# Patient Record
Sex: Male | Born: 1985
Health system: Southern US, Community
[De-identification: ages and names within clinical notes are randomized; demographics above are authoritative.]

## PROBLEM LIST (undated history)

## (undated) DIAGNOSIS — K219 Gastro-esophageal reflux disease without esophagitis: Secondary | ICD-10-CM

## (undated) DIAGNOSIS — R519 Headache, unspecified: Secondary | ICD-10-CM

## (undated) DIAGNOSIS — K439 Ventral hernia without obstruction or gangrene: Secondary | ICD-10-CM

## (undated) DIAGNOSIS — R51 Headache: Secondary | ICD-10-CM

## (undated) DIAGNOSIS — I10 Essential (primary) hypertension: Secondary | ICD-10-CM

## (undated) HISTORY — PX: WISDOM TOOTH EXTRACTION: SHX21

## (undated) HISTORY — PX: VASECTOMY: SHX75

## (undated) HISTORY — PX: HERNIA REPAIR: SHX51

---

## 2012-12-19 ENCOUNTER — Encounter (HOSPITAL_COMMUNITY): Payer: Self-pay | Admitting: Cardiology

## 2012-12-19 ENCOUNTER — Emergency Department (HOSPITAL_COMMUNITY)
Admission: EM | Admit: 2012-12-19 | Discharge: 2012-12-19 | Disposition: A | Discharge status: Home / Self Care | Payer: 59 | Attending: Emergency Medicine | Admitting: Emergency Medicine

## 2012-12-19 ENCOUNTER — Emergency Department (HOSPITAL_COMMUNITY): Payer: 59

## 2012-12-19 DIAGNOSIS — R112 Nausea with vomiting, unspecified: Secondary | ICD-10-CM | POA: Insufficient documentation

## 2012-12-19 DIAGNOSIS — N200 Calculus of kidney: Secondary | ICD-10-CM | POA: Insufficient documentation

## 2012-12-19 LAB — URINALYSIS, ROUTINE W REFLEX MICROSCOPIC
Bilirubin Urine: NEGATIVE
Hgb urine dipstick: NEGATIVE
Nitrite: NEGATIVE
Specific Gravity, Urine: 1.021 (ref 1.005–1.030)
Urobilinogen, UA: 1 mg/dL (ref 0.0–1.0)
pH: 8.5 — ABNORMAL HIGH (ref 5.0–8.0)

## 2012-12-19 LAB — CBC WITH DIFFERENTIAL/PLATELET
Basophils Absolute: 0 10*3/uL (ref 0.0–0.1)
Basophils Relative: 0 % (ref 0–1)
Eosinophils Absolute: 0.1 10*3/uL (ref 0.0–0.7)
Hemoglobin: 14.6 g/dL (ref 13.0–17.0)
MCH: 28.5 pg (ref 26.0–34.0)
MCHC: 35.4 g/dL (ref 30.0–36.0)
Neutro Abs: 5.7 10*3/uL (ref 1.7–7.7)
Neutrophils Relative %: 64 % (ref 43–77)
Platelets: 281 10*3/uL (ref 150–400)
RDW: 13 % (ref 11.5–15.5)

## 2012-12-19 LAB — LIPASE, BLOOD: Lipase: 11 U/L (ref 11–59)

## 2012-12-19 LAB — HEPATIC FUNCTION PANEL
ALT: 32 U/L (ref 0–53)
AST: 26 U/L (ref 0–37)
Bilirubin, Direct: 0.1 mg/dL (ref 0.0–0.3)
Total Protein: 7.5 g/dL (ref 6.0–8.3)

## 2012-12-19 LAB — BASIC METABOLIC PANEL
Chloride: 102 mEq/L (ref 96–112)
GFR calc Af Amer: >90 mL/min (ref >90)
GFR calc non Af Amer: 84 mL/min — ABNORMAL LOW (ref >90)
Potassium: 3.4 mEq/L — ABNORMAL LOW (ref 3.5–5.1)
Sodium: 138 mEq/L (ref 135–145)

## 2012-12-19 LAB — URINE MICROSCOPIC-ADD ON

## 2012-12-19 MED ORDER — KETOROLAC TROMETHAMINE 30 MG/ML IJ SOLN
15.0000 mg | Freq: Once | INTRAMUSCULAR | Status: AC
  Administered 2012-12-19: 15 mg via INTRAVENOUS
  Filled 2012-12-19: qty 1

## 2012-12-19 MED ORDER — HYDROMORPHONE HCL PF 1 MG/ML IJ SOLN
1.0000 mg | Freq: Once | INTRAMUSCULAR | Status: AC
  Administered 2012-12-19: 1 mg via INTRAVENOUS
  Filled 2012-12-19: qty 1

## 2012-12-19 MED ORDER — PROMETHAZINE HCL 25 MG PO TABS: 25.0000 mg | ORAL_TABLET | Freq: Four times a day (QID) | ORAL | Status: DC | PRN

## 2012-12-19 MED ORDER — ONDANSETRON HCL 4 MG/2ML IJ SOLN
4.0000 mg | Freq: Once | INTRAMUSCULAR | Status: AC
  Administered 2012-12-19: 4 mg via INTRAVENOUS
  Filled 2012-12-19: qty 2

## 2012-12-19 MED ORDER — OXYCODONE-ACETAMINOPHEN 5-325 MG PO TABS: ORAL_TABLET | ORAL | Status: DC

## 2012-12-19 MED ORDER — HYDROMORPHONE HCL PF 1 MG/ML IJ SOLN: 0.5000 mg | Freq: Once | INTRAMUSCULAR | Status: DC

## 2012-12-19 MED ORDER — HYDROMORPHONE HCL PF 1 MG/ML IJ SOLN
0.5000 mg | Freq: Once | INTRAMUSCULAR | Status: AC
  Administered 2012-12-19: 0.5 mg via INTRAVENOUS
  Filled 2012-12-19: qty 1

## 2012-12-19 NOTE — ED Notes (Signed)
Pt reports he just started having right sided abd pain and flank pain this morning. Pt is restless and vomiting at triage. Denies any hx of kidney stones.

## 2012-12-19 NOTE — ED Provider Notes (Signed)
CSN: [id number]     Arrival date & time 12/19/12  1139 History     First MD Initiated Contact with Patient 12/19/12 1157     Chief Complaint  Patient presents with  . Abdominal Pain  . Nausea  . Emesis   (Consider location/radiation/quality/duration/timing/severity/associated sxs/prior Treatment) HPI  EBON KETCHUM is a 27 y.o. male with no significant past medical history complaining of acute onset of left-sided mid abdominal pain, radiating to back and groin, acute onset at 11 AM today. No exacerbating or alleviating factors identified. Pain is described as sharp, 10 out of 10. No prior similar episodes, no prior history of kidney stones. Patient denies hematuria or dysuria. Associated symptoms of multiple episodes of nonbloody, nonbilious, no coffee ground emesis.    History reviewed. No pertinent past medical history. History reviewed. No pertinent past surgical history. History reviewed. No pertinent family history. History  Substance Use Topics  . Smoking status: Never Smoker   . Smokeless tobacco: Not on file  . Alcohol Use: Yes    Review of Systems 10 systems reviewed and found to be negative, except as noted in the HPI   Allergies  Review of patient's allergies indicates no known allergies.  Home Medications   Current Outpatient Rx  Name  Route  Sig  Dispense  Refill  . acetaminophen (TYLENOL) 325 MG tablet   Oral   Take 650 mg by mouth every 6 (six) hours as needed for pain.         Marland Kitchen ibuprofen (ADVIL,MOTRIN) 200 MG tablet   Oral   Take 200 mg by mouth every 6 (six) hours as needed for pain.          Pulse 119  Temp(Src) 97.8 F (36.6 C) (Oral)  Resp 30  SpO2 100% Physical Exam  Nursing note and vitals reviewed. Constitutional: He is oriented to person, place, and time. He appears well-developed and well-nourished. No distress.  HENT:  Head: Normocephalic.  Mouth/Throat: Oropharynx is clear and moist.  Eyes: Conjunctivae and EOM are normal.  Pupils are equal, round, and reactive to light.  Neck: Normal range of motion.  Cardiovascular: Normal rate, regular rhythm and intact distal pulses.   Pulmonary/Chest: Effort normal and breath sounds normal. No stridor. No respiratory distress. He has no wheezes. He has no rales. He exhibits no tenderness.  Abdominal: Soft. Bowel sounds are normal. He exhibits no distension and no mass. There is no tenderness. There is no rebound and no guarding.  Pain is not exacerbated by palpation  Genitourinary:  No CVA tenderness bilaterally  Musculoskeletal: Normal range of motion.  Neurological: He is alert and oriented to person, place, and time.  Psychiatric: He has a normal mood and affect.    ED Course   Procedures (including critical care time)  Labs Reviewed  BASIC METABOLIC PANEL - Abnormal; Notable for the following:    Potassium 3.4 (*)    Glucose, Bld 131 (*)    GFR calc non Af Amer 84 (*)    All other components within normal limits  URINALYSIS, ROUTINE W REFLEX MICROSCOPIC - Abnormal; Notable for the following:    APPearance TURBID (*)    pH 8.5 (*)    Protein, ur 100 (*)    All other components within normal limits  CBC WITH DIFFERENTIAL  HEPATIC FUNCTION PANEL  LIPASE, BLOOD  URINE MICROSCOPIC-ADD ON   No results found. 1. Right kidney stone     MDM   Filed Vitals:  12/19/12 1415 12/19/12 1500 12/19/12 1530 12/19/12 1545  BP: 110/60 100/50 122/70   Pulse: 53 58  55  Temp:      TempSrc:      Resp:      SpO2: 100% 100%  99%     EIAN VANDERVELDEN is a 27 y.o. male with acute onset of right flank pain. CT shows 2 mm distal right ureteral calculus. UA is not consistent with infection. Pain is significantly improved in the ED and patient is instructed to followup with [facility].  Medications  ondansetron (ZOFRAN) injection 4 mg (4 mg Intravenous Given 12/19/12 1226)  HYDROmorphone (DILAUDID) injection 1 mg (1 mg Intravenous Given 12/19/12 1227)   HYDROmorphone (DILAUDID) injection 0.5 mg (0.5 mg Intravenous Given 12/19/12 1246)  HYDROmorphone (DILAUDID) injection 1 mg (1 mg Intravenous Given 12/19/12 1351)  ketorolac (TORADOL) 30 MG/ML injection 15 mg (15 mg Intravenous Given 12/19/12 1351)    Pt is hemodynamically stable, appropriate for, and amenable to discharge at this time. Pt verbalized understanding and agrees with care plan. All questions answered. Outpatient follow-up and specific return precautions discussed.    Discharge Medication List as of 12/19/2012  3:38 PM    START taking these medications   Details  oxyCODONE-acetaminophen (PERCOCET/ROXICET) 5-325 MG per tablet 1 to 2 tabs PO q6hrs  PRN for pain, Print    promethazine (PHENERGAN) 25 MG tablet Take 1 tablet (25 mg total) by mouth every 6 (six) hours as needed for nausea., Starting 12/19/2012, Until Discontinued, Print        Note: Portions of this report may have been transcribed using voice recognition software. Every effort was made to ensure accuracy; however, inadvertent computerized transcription errors may be present    Wynetta Emery, PA-C 12/22/12 0101

## 2012-12-19 NOTE — ED Notes (Signed)
Pt tolerating PO intake well. Denies N/V.

## 2012-12-27 NOTE — ED Provider Notes (Signed)
Medical screening examination/treatment/procedure(s) were performed by non-physician practitioner and as supervising physician I was immediately available for consultation/collaboration.  Demetrion Wesby T Derrell Milanes, MD 12/27/12 2058 

## 2015-12-10 ENCOUNTER — Other Ambulatory Visit (HOSPITAL_COMMUNITY): Payer: Self-pay | Admitting: Family Medicine

## 2015-12-10 ENCOUNTER — Ambulatory Visit (HOSPITAL_COMMUNITY)
Admission: RE | Admit: 2015-12-10 | Discharge: 2015-12-10 | Disposition: A | Discharge status: Home / Self Care | Payer: Commercial Managed Care - HMO | Source: Ambulatory Visit | Attending: Family Medicine | Admitting: Family Medicine

## 2015-12-10 DIAGNOSIS — R7989 Other specified abnormal findings of blood chemistry: Secondary | ICD-10-CM

## 2015-12-10 DIAGNOSIS — R791 Abnormal coagulation profile: Secondary | ICD-10-CM | POA: Diagnosis not present

## 2015-12-10 NOTE — Progress Notes (Signed)
VASCULAR LAB PRELIMINARY  PRELIMINARY  PRELIMINARY  PRELIMINARY  Right lower extremity venous duplex has been  completed.    Right:  No evidence of DVT, superficial thrombosis, or Baker's cyst.  Called Dr. Chase CallerSun's office with results.  Jenetta Logesami Hillari Zumwalt, RVT, RDMS 12/10/2015, 4:57 PM

## 2016-06-05 DIAGNOSIS — J0191 Acute recurrent sinusitis, unspecified: Secondary | ICD-10-CM | POA: Diagnosis not present

## 2016-07-02 DIAGNOSIS — I10 Essential (primary) hypertension: Secondary | ICD-10-CM | POA: Diagnosis not present

## 2016-07-02 DIAGNOSIS — G4709 Other insomnia: Secondary | ICD-10-CM | POA: Diagnosis not present

## 2016-08-07 DIAGNOSIS — K439 Ventral hernia without obstruction or gangrene: Secondary | ICD-10-CM | POA: Diagnosis not present

## 2016-08-08 ENCOUNTER — Ambulatory Visit: Payer: Self-pay | Admitting: Surgery

## 2016-08-08 DIAGNOSIS — K439 Ventral hernia without obstruction or gangrene: Secondary | ICD-10-CM | POA: Diagnosis not present

## 2016-08-08 NOTE — H&P (Signed)
Matthew Bush 08/08/2016 10:19 AM Location: [facility] Patient #: [id number] DOB: [birth year] Married / Language: Undefined / Race: Black or African American Male  History of Present Illness [name] [name] MD; 08/08/2016 10:45 AM) Patient words: Patient sent at the request of Dr. [name] for a fold just above his umbilicus. It was noticed 2 days ago. He sought medical care was felt to have a reducible ventral hernia. It is sore causing mild to moderate discomfort with exertion. Location of pain is above the umbilicus. There is no radiation. It is made worse with lifting or pushing. It is a 4-5 out of 10 with exertion. No nausea or vomiting. No change in bowel habits.  The patient is a 31 year old male who presents with an umbilical hernia. This hernia is thought to be acquired.   Past Surgical History [name], RN; 08/08/2016 10:22 AM) Oral Surgery  Diagnostic Studies History [name], RN; 08/08/2016 10:22 AM) Colonoscopy never  Allergies [name], RN; 08/08/2016 10:20 AM) No Known Drug Allergies 08/08/2016  Medication History [name], RN; 08/08/2016 10:21 AM) Escitalopram Oxalate (  Tablet, Oral) Active. AmLODIPine Besylate (  Tablet, Oral) Active. Losartan Potassium-HCTZ (100-25MG  Tablet, Oral) Active. Medications Reconciled  Other Problems [name], RN; 08/08/2016 10:22 AM) Anxiety Disorder High blood pressure Kidney Stone     Review of Systems [name] RN; 08/08/2016 10:22 AM) General Not Present- Appetite Loss, Chills, Fatigue, Fever, Night Sweats, Weight Gain and Weight Loss. Skin Not Present- Change in Wart/Mole, Dryness, Hives, Jaundice, New Lesions, Non-Healing Wounds, Rash and Ulcer. HEENT Not Present- Earache, Hearing Loss, Hoarseness, Nose Bleed, Oral Ulcers, Ringing in the Ears, Seasonal Allergies, Sinus Pain, Sore Throat, Visual Disturbances, Wears glasses/contact lenses and Yellow Eyes. Respiratory Not  Present- Bloody sputum, Chronic Cough, Difficulty Breathing, Snoring and Wheezing. Breast Not Present- Breast Mass, Breast Pain, Nipple Discharge and Skin Changes. Cardiovascular Not Present- Chest Pain, Difficulty Breathing Lying Down, Leg Cramps, Palpitations, Rapid Heart Rate, Shortness of Breath and Swelling of Extremities. Gastrointestinal Present- Abdominal Pain. Not Present- Bloating, Bloody Stool, Change in Bowel Habits, Chronic diarrhea, Constipation, Difficulty Swallowing, Excessive gas, Gets full quickly at meals, Hemorrhoids, Indigestion, Nausea, Rectal Pain and Vomiting. Male Genitourinary Not Present- Blood in Urine, Change in Urinary Stream, Frequency, Impotence, Nocturia, Painful Urination, Urgency and Urine Leakage. Musculoskeletal Not Present- Back Pain, Joint Pain, Joint Stiffness, Muscle Pain, Muscle Weakness and Swelling of Extremities. Neurological Not Present- Decreased Memory, Fainting, Headaches, Numbness, Seizures, Tingling, Tremor, Trouble walking and Weakness. Psychiatric Present- Anxiety. Not Present- Bipolar, Change in Sleep Pattern, Depression, Fearful and Frequent crying. Endocrine Not Present- Cold Intolerance, Excessive Hunger, Hair Changes, Heat Intolerance, Hot flashes and New Diabetes. Hematology Not Present- Blood Thinners, Easy Bruising, Excessive bleeding, Gland problems, HIV and Persistent Infections.  Vitals [name] RN; 08/08/2016 10:23 AM) 08/08/2016 10:22 AM Weight: 272.38 lb Height: 67in Height was reported by patient. Body Surface Area: 2.31 m Body Mass Index: 42.66 kg/m  Temp.: 54F(Oral)  Pulse: 60 (Regular)  P.OX: 99% (Room air) BP: 148/88 (Sitting, Left Arm, Standard)      Physical Exam (Gracelin Weisberg A. Edan Juday MD; 08/08/2016 10:46 AM)  General Mental Status-Alert. General Appearance-Consistent with stated age. Hydration-Well hydrated. Voice-Normal.  Head and Neck Head-normocephalic, atraumatic with no lesions or  palpable masses. Trachea-midline. Thyroid Gland Characteristics - normal size and consistency.  Eye Eyeball - Bilateral-Extraocular movements intact. Sclera/Conjunctiva - Bilateral-No scleral icterus.  Chest and Lung Exam Chest and lung exam reveals -quiet, even and easy respiratory effort  with no use of accessory muscles and on auscultation, normal breath sounds, no adventitious sounds and normal vocal resonance. Inspection Chest Wall - Normal. Back - normal.  Cardiovascular Cardiovascular examination reveals -normal heart sounds, regular rate and rhythm with no murmurs and normal pedal pulses bilaterally.  Abdomen Note: Reducible ventral hernia just above the umbilicus measuring 3 cm.  Musculoskeletal Normal Exam - Left-Upper Extremity Strength Normal and Lower Extremity Strength Normal. Normal Exam - Right-Upper Extremity Strength Normal and Lower Extremity Strength Normal.    Assessment & Plan ([name] A. Dale Strausser MD; 08/08/2016 10:46 AM)  VENTRAL HERNIA WITHOUT OBSTRUCTION OR GANGRENE (K43.9) Impression: Discuss repair options with mesh. Risks, benefits and all traditional surgery were discussed today. He desires repair of this. The risk of hernia repair include bleeding, infection, organ injury, bowel injury, bladder injury, nerve injury recurrent hernia, blood clots, worsening of underlying condition, chronic pain, mesh use, open surgery, death, and the need for other operattions. Pt agrees to proceed  Current Plans You are being scheduled for surgery- Our schedulers will call you.  You should hear from our office's scheduling department within 5 working days about the location, date, and time of surgery. We try to make accommodations for patient's preferences in scheduling surgery, but sometimes the OR schedule or the surgeon's schedule prevents [place] from making those accommodations.  If you have not heard from our office [phone]) in 5 working days, call  the office and ask for your surgeon's nurse.  If you have other questions about your diagnosis, plan, or surgery, call the office and ask for your surgeon's nurse.  Pt Education - Pamphlet Given - Hernia Surgery: discussed with patient and provided information. The anatomy & physiology of the abdominal wall was discussed. The pathophysiology of hernias was discussed. Natural history risks without surgery including progeressive enlargement, pain, incarceration, & strangulation was discussed. Contributors to complications such as smoking, obesity, diabetes, prior surgery, etc were discussed.  I feel the risks of no intervention will lead to serious problems that outweigh the operative risks; therefore, I recommended surgery to reduce and repair the hernia. I explained laparoscopic techniques with possible need for an open approach. I noted the probable use of mesh to patch and/or buttress the hernia repair  Risks such as bleeding, infection, abscess, need for further treatment, heart attack, death, and other risks were discussed. I noted a good likelihood this will help address the problem. Goals of post-operative recovery were discussed as well. Possibility that this will not correct all symptoms was explained. I stressed the importance of low-impact activity, aggressive pain control, avoiding constipation, & not pushing through pain to minimize risk of post-operative chronic pain or injury. Possibility of reherniation especially with smoking, obesity, diabetes, immunosuppression, and other health conditions was discussed. We will work to minimize complications.  An educational handout further explaining the pathology & treatment options was given as well. Questions were answered. The patient expresses understanding & wishes to proceed with surgery.

## 2016-09-02 ENCOUNTER — Ambulatory Visit: Payer: Commercial Managed Care - HMO | Admitting: Family Medicine

## 2016-09-11 DIAGNOSIS — J342 Deviated nasal septum: Secondary | ICD-10-CM | POA: Diagnosis not present

## 2016-09-11 DIAGNOSIS — J343 Hypertrophy of nasal turbinates: Secondary | ICD-10-CM | POA: Diagnosis not present

## 2016-09-11 DIAGNOSIS — J0141 Acute recurrent pansinusitis: Secondary | ICD-10-CM | POA: Diagnosis not present

## 2016-10-22 DIAGNOSIS — J343 Hypertrophy of nasal turbinates: Secondary | ICD-10-CM | POA: Diagnosis not present

## 2016-10-22 DIAGNOSIS — J342 Deviated nasal septum: Secondary | ICD-10-CM | POA: Diagnosis not present

## 2016-10-22 DIAGNOSIS — J329 Chronic sinusitis, unspecified: Secondary | ICD-10-CM | POA: Diagnosis not present

## 2016-10-22 DIAGNOSIS — J320 Chronic maxillary sinusitis: Secondary | ICD-10-CM | POA: Diagnosis not present

## 2016-11-11 ENCOUNTER — Ambulatory Visit: Payer: Self-pay | Admitting: Surgery

## 2016-11-11 NOTE — H&P (Signed)
Matthew Bush 08/08/2016 10:19 AM Location: [facility] Patient #: [id number] DOB: [birth year] Married / Language: Undefined / Race: Black or African American Male   History of Present Illness Matthew Fus A. [name] MD; 08/08/2016 10:45 AM) Patient words: Patient sent at the request of Dr. Erling Bush for a fold just above his umbilicus. It was noticed 2 days ago. He sought medical care was felt to have a reducible ventral hernia. It is sore causing mild to moderate discomfort with exertion. Location of pain is above the umbilicus. There is no radiation. It is made worse with lifting or pushing. It is a 4-5 out of 10 with exertion. No nausea or vomiting. No change in bowel habits.  The patient is a 31 year old male who presents with an umbilical hernia. This hernia is thought to be acquired.   Past Surgical History Matthew Glassing, RN; 08/08/2016 10:22 AM) Oral Surgery   Diagnostic Studies History Matthew Glassing, RN; 08/08/2016 10:22 AM) Colonoscopy  never  Allergies Matthew Glassing, RN; 08/08/2016 10:20 AM) No Known Drug Allergies 08/08/2016  Medication History Matthew Glassing, RN; 08/08/2016 10:21 AM) Escitalopram Oxalate (10MG  Tablet, Oral) Active. AmLODIPine Besylate (10MG  Tablet, Oral) Active. Losartan Potassium-HCTZ (100-25MG  Tablet, Oral) Active. Medications Reconciled  Other Problems Matthew Glassing, RN; 08/08/2016 10:22 AM) Anxiety Disorder  High blood pressure  Kidney Stone     Review of Systems Matthew Glassing RN; 08/08/2016 10:22 AM) General Not Present- Appetite Loss, Chills, Fatigue, Fever, Night Sweats, Weight Gain and Weight Loss. Skin Not Present- Change in Wart/Mole, Dryness, Hives, Jaundice, New Lesions, Non-Healing Wounds, Rash and Ulcer. HEENT Not Present- Earache, Hearing Loss, Hoarseness, Nose Bleed, Oral Ulcers, Ringing in the Ears, Seasonal Allergies, Sinus Pain, Sore Throat, Visual Disturbances, Wears glasses/contact lenses and Yellow Eyes. Respiratory  Not Present- Bloody sputum, Chronic Cough, Difficulty Breathing, Snoring and Wheezing. Breast Not Present- Breast Mass, Breast Pain, Nipple Discharge and Skin Changes. Cardiovascular Not Present- Chest Pain, Difficulty Breathing Lying Down, Leg Cramps, Palpitations, Rapid Heart Rate, Shortness of Breath and Swelling of Extremities. Gastrointestinal Present- Abdominal Pain. Not Present- Bloating, Bloody Stool, Change in Bowel Habits, Chronic diarrhea, Constipation, Difficulty Swallowing, Excessive gas, Gets full quickly at meals, Hemorrhoids, Indigestion, Nausea, Rectal Pain and Vomiting. Male Genitourinary Not Present- Blood in Urine, Change in Urinary Stream, Frequency, Impotence, Nocturia, Painful Urination, Urgency and Urine Leakage. Musculoskeletal Not Present- Back Pain, Joint Pain, Joint Stiffness, Muscle Pain, Muscle Weakness and Swelling of Extremities. Neurological Not Present- Decreased Memory, Fainting, Headaches, Numbness, Seizures, Tingling, Tremor, Trouble walking and Weakness. Psychiatric Present- Anxiety. Not Present- Bipolar, Change in Sleep Pattern, Depression, Fearful and Frequent crying. Endocrine Not Present- Cold Intolerance, Excessive Hunger, Hair Changes, Heat Intolerance, Hot flashes and New Diabetes. Hematology Not Present- Blood Thinners, Easy Bruising, Excessive bleeding, Gland problems, HIV and Persistent Infections.  Vitals Matthew Glassing RN; 08/08/2016 10:23 AM) 08/08/2016 10:22 AM Weight: 272.38 lb Height: 67in Height was reported by patient. Body Surface Area: 2.31 m Body Mass Index: 42.66 kg/m  Temp.: 43F(Oral)  Pulse: 60 (Regular)  P.OX: 99% (Room air) BP: 148/88 (Sitting, Left Arm, Standard)       Physical Exam (Matthew Shasteen A. [name] MD; 08/08/2016 10:46 AM) General Mental Status-Alert. General Appearance-Consistent with stated age. Hydration-Well hydrated. Voice-Normal.  Head and Neck Head-normocephalic, atraumatic with no lesions  or palpable masses. Trachea-midline. Thyroid Gland Characteristics - normal size and consistency.  Eye Eyeball - Bilateral-Extraocular movements intact. Sclera/Conjunctiva - Bilateral-No scleral icterus.  Chest and Lung Exam Chest and lung exam reveals -quiet,  even and easy respiratory effort with no use of accessory muscles and on auscultation, normal breath sounds, no adventitious sounds and normal vocal resonance. Inspection Chest Wall - Normal. Back - normal.  Cardiovascular Cardiovascular examination reveals -normal heart sounds, regular rate and rhythm with no murmurs and normal pedal pulses bilaterally.  Abdomen Note: Reducible ventral hernia just above the umbilicus measuring 3 cm.   Musculoskeletal Normal Exam - Left-Upper Extremity Strength Normal and Lower Extremity Strength Normal. Normal Exam - Right-Upper Extremity Strength Normal and Lower Extremity Strength Normal.    Assessment & Plan (Matthew Bodiford A. [name] MD; 08/08/2016 10:46 AM) VENTRAL HERNIA WITHOUT OBSTRUCTION OR GANGRENE (K43.9) Impression: Discuss repair options with mesh. Risks, benefits and all traditional surgery were discussed today. He desires repair of this. The risk of hernia repair include bleeding, infection, organ injury, bowel injury, bladder injury, nerve injury recurrent hernia, blood clots, worsening of underlying condition, chronic pain, mesh use, open surgery, death, and the need for other operattions. Pt agrees to proceed Current Plans You are being scheduled for surgery- Our schedulers will call you.  You should hear from our office's scheduling department within 5 working days about the location, date, and time of surgery. We try to make accommodations for patient's preferences in scheduling surgery, but sometimes the OR schedule or the surgeon's schedule prevents us from making those accommodations.  If you have not heard from our office [phone]) in 5 working days,  call the office and ask for your surgeon's nurse.  If you have other questions about your diagnosis, plan, or surgery, call the office and ask for your surgeon's nurse.  Pt Education - Pamphlet Given - Hernia Surgery: discussed with patient and provided information. The anatomy & physiology of the abdominal wall was discussed. The pathophysiology of hernias was discussed. Natural history risks without surgery including progeressive enlargement, pain, incarceration, & strangulation was discussed. Contributors to complications such as smoking, obesity, diabetes, prior surgery, etc were discussed.  I feel the risks of no intervention will lead to serious problems that outweigh the operative risks; therefore, I recommended surgery to reduce and repair the hernia. I explained laparoscopic techniques with possible need for an open approach. I noted the probable use of mesh to patch and/or buttress the hernia repair  Risks such as bleeding, infection, abscess, need for further treatment, heart attack, death, and other risks were discussed. I noted a good likelihood this will help address the problem. Goals of post-operative recovery were discussed as well. Possibility that this will not correct all symptoms was explained. I stressed the importance of low-impact activity, aggressive pain control, avoiding constipation, & not pushing through pain to minimize risk of post-operative chronic pain or injury. Possibility of reherniation especially with smoking, obesity, diabetes, immunosuppression, and other health conditions was discussed. We will work to minimize complications.  An educational handout further explaining the pathology & treatment options was given as well. Questions were answered. The patient expresses understanding & wishes to proceed with surgery.    Signed by Dortha Schwalbehomas A Neeraj Housand, MD (08/08/2016 10:47 AM)

## 2016-11-25 ENCOUNTER — Other Ambulatory Visit: Payer: Self-pay | Admitting: Otolaryngology

## 2016-11-26 DIAGNOSIS — K439 Ventral hernia without obstruction or gangrene: Secondary | ICD-10-CM

## 2016-11-26 HISTORY — DX: Ventral hernia without obstruction or gangrene: K43.9

## 2016-12-02 ENCOUNTER — Encounter (HOSPITAL_BASED_OUTPATIENT_CLINIC_OR_DEPARTMENT_OTHER)
Admission: RE | Admit: 2016-12-02 | Discharge: 2016-12-02 | Disposition: A | Discharge status: Home / Self Care | Payer: 59 | Source: Ambulatory Visit | Attending: Surgery | Admitting: Surgery

## 2016-12-02 ENCOUNTER — Encounter (HOSPITAL_BASED_OUTPATIENT_CLINIC_OR_DEPARTMENT_OTHER): Payer: Self-pay | Admitting: *Deleted

## 2016-12-02 DIAGNOSIS — Z01818 Encounter for other preprocedural examination: Secondary | ICD-10-CM | POA: Diagnosis not present

## 2016-12-02 DIAGNOSIS — I10 Essential (primary) hypertension: Secondary | ICD-10-CM | POA: Insufficient documentation

## 2016-12-02 LAB — BASIC METABOLIC PANEL
Anion gap: 8 (ref 5–15)
BUN: 16 mg/dL (ref 6–20)
CALCIUM: 9.6 mg/dL (ref 8.9–10.3)
CO2: 26 mmol/L (ref 22–32)
CREATININE: 1.07 mg/dL (ref 0.61–1.24)
Chloride: 103 mmol/L (ref 101–111)
GFR calc non Af Amer: >60 mL/min (ref >60)
Glucose, Bld: 96 mg/dL (ref 65–99)
Potassium: 4.1 mmol/L (ref 3.5–5.1)
SODIUM: 137 mmol/L (ref 135–145)

## 2016-12-09 ENCOUNTER — Encounter (HOSPITAL_BASED_OUTPATIENT_CLINIC_OR_DEPARTMENT_OTHER)
Admission: RE | Disposition: A | Discharge status: Home / Self Care | Payer: Self-pay | Source: Ambulatory Visit | Attending: Surgery

## 2016-12-09 ENCOUNTER — Ambulatory Visit (HOSPITAL_BASED_OUTPATIENT_CLINIC_OR_DEPARTMENT_OTHER): Payer: 59 | Admitting: Certified Registered Nurse Anesthetist

## 2016-12-09 ENCOUNTER — Encounter (HOSPITAL_BASED_OUTPATIENT_CLINIC_OR_DEPARTMENT_OTHER): Payer: Self-pay | Admitting: *Deleted

## 2016-12-09 ENCOUNTER — Ambulatory Visit (HOSPITAL_BASED_OUTPATIENT_CLINIC_OR_DEPARTMENT_OTHER)
Admission: RE | Admit: 2016-12-09 | Discharge: 2016-12-09 | Disposition: A | Discharge status: Home / Self Care | Payer: 59 | Source: Ambulatory Visit | Attending: Surgery | Admitting: Surgery

## 2016-12-09 DIAGNOSIS — I10 Essential (primary) hypertension: Secondary | ICD-10-CM | POA: Diagnosis not present

## 2016-12-09 DIAGNOSIS — Z79899 Other long term (current) drug therapy: Secondary | ICD-10-CM | POA: Insufficient documentation

## 2016-12-09 DIAGNOSIS — F419 Anxiety disorder, unspecified: Secondary | ICD-10-CM | POA: Insufficient documentation

## 2016-12-09 DIAGNOSIS — Z6841 Body Mass Index (BMI) 40.0 and over, adult: Secondary | ICD-10-CM | POA: Diagnosis not present

## 2016-12-09 DIAGNOSIS — K436 Other and unspecified ventral hernia with obstruction, without gangrene: Secondary | ICD-10-CM | POA: Diagnosis not present

## 2016-12-09 DIAGNOSIS — F17200 Nicotine dependence, unspecified, uncomplicated: Secondary | ICD-10-CM | POA: Insufficient documentation

## 2016-12-09 DIAGNOSIS — K439 Ventral hernia without obstruction or gangrene: Secondary | ICD-10-CM | POA: Diagnosis not present

## 2016-12-09 HISTORY — PX: INSERTION OF MESH: SHX5868

## 2016-12-09 HISTORY — DX: Ventral hernia without obstruction or gangrene: K43.9

## 2016-12-09 HISTORY — DX: Essential (primary) hypertension: I10

## 2016-12-09 HISTORY — DX: Gastro-esophageal reflux disease without esophagitis: K21.9

## 2016-12-09 HISTORY — DX: Headache, unspecified: R51.9

## 2016-12-09 HISTORY — DX: Headache: R51

## 2016-12-09 HISTORY — PX: VENTRAL HERNIA REPAIR: SHX424

## 2016-12-09 SURGERY — REPAIR, HERNIA, VENTRAL
Anesthesia: General | Site: Abdomen

## 2016-12-09 MED ORDER — OXYCODONE HCL 5 MG PO TABS
ORAL_TABLET | ORAL | Status: AC
  Filled 2016-12-09: qty 1

## 2016-12-09 MED ORDER — DEXAMETHASONE SODIUM PHOSPHATE 10 MG/ML IJ SOLN: 10.0000 mg | Freq: Once | INTRAMUSCULAR | Status: DC

## 2016-12-09 MED ORDER — MIDAZOLAM HCL 2 MG/2ML IJ SOLN
INTRAMUSCULAR | Status: AC
  Filled 2016-12-09: qty 2

## 2016-12-09 MED ORDER — BUPIVACAINE-EPINEPHRINE 0.25% -1:200000 IJ SOLN
INTRAMUSCULAR | Status: DC | PRN
  Administered 2016-12-09: 20 mL

## 2016-12-09 MED ORDER — FENTANYL CITRATE (PF) 100 MCG/2ML IJ SOLN
INTRAMUSCULAR | Status: AC
  Filled 2016-12-09: qty 2

## 2016-12-09 MED ORDER — CEFAZOLIN SODIUM-DEXTROSE 2-4 GM/100ML-% IV SOLN: 2.0000 g | INTRAVENOUS | Status: DC

## 2016-12-09 MED ORDER — MIDAZOLAM HCL 2 MG/2ML IJ SOLN
1.0000 mg | INTRAMUSCULAR | Status: DC | PRN
  Administered 2016-12-09: 2 mg via INTRAVENOUS

## 2016-12-09 MED ORDER — HYDROMORPHONE HCL 1 MG/ML IJ SOLN
0.2500 mg | INTRAMUSCULAR | Status: DC | PRN
  Administered 2016-12-09: 0.5 mg via INTRAVENOUS

## 2016-12-09 MED ORDER — PHENYLEPHRINE 40 MCG/ML (10ML) SYRINGE FOR IV PUSH (FOR BLOOD PRESSURE SUPPORT)
PREFILLED_SYRINGE | INTRAVENOUS | Status: AC
  Filled 2016-12-09: qty 20

## 2016-12-09 MED ORDER — CHLORHEXIDINE GLUCONATE CLOTH 2 % EX PADS: 6.0000 | MEDICATED_PAD | Freq: Once | CUTANEOUS | Status: DC

## 2016-12-09 MED ORDER — SCOPOLAMINE 1 MG/3DAYS TD PT72: 1.0000 | MEDICATED_PATCH | Freq: Once | TRANSDERMAL | Status: DC | PRN

## 2016-12-09 MED ORDER — CEFAZOLIN SODIUM-DEXTROSE 2-4 GM/100ML-% IV SOLN
INTRAVENOUS | Status: AC
  Filled 2016-12-09: qty 100

## 2016-12-09 MED ORDER — OXYCODONE HCL 5 MG PO TABS: 5.0000 mg | ORAL_TABLET | Freq: Four times a day (QID) | ORAL | 0 refills | Status: DC | PRN

## 2016-12-09 MED ORDER — LIDOCAINE 2% (20 MG/ML) 5 ML SYRINGE
INTRAMUSCULAR | Status: AC
  Filled 2016-12-09: qty 15

## 2016-12-09 MED ORDER — DEXAMETHASONE SODIUM PHOSPHATE 4 MG/ML IJ SOLN
INTRAMUSCULAR | Status: DC | PRN
  Administered 2016-12-09: 10 mg via INTRAVENOUS

## 2016-12-09 MED ORDER — FENTANYL CITRATE (PF) 100 MCG/2ML IJ SOLN
50.0000 ug | INTRAMUSCULAR | Status: DC | PRN
  Administered 2016-12-09: 50 ug via INTRAVENOUS
  Administered 2016-12-09: 100 ug via INTRAVENOUS

## 2016-12-09 MED ORDER — DEXTROSE 5 % IV SOLN: 3.0000 g | Freq: Once | INTRAVENOUS | Status: DC

## 2016-12-09 MED ORDER — MEPERIDINE HCL 25 MG/ML IJ SOLN: 6.2500 mg | INTRAMUSCULAR | Status: DC | PRN

## 2016-12-09 MED ORDER — LACTATED RINGERS IV SOLN
INTRAVENOUS | Status: DC
  Administered 2016-12-09 (×2): via INTRAVENOUS

## 2016-12-09 MED ORDER — 0.9 % SODIUM CHLORIDE (POUR BTL) OPTIME
TOPICAL | Status: DC | PRN
  Administered 2016-12-09: 1000 mL

## 2016-12-09 MED ORDER — PROPOFOL 10 MG/ML IV BOLUS
INTRAVENOUS | Status: AC
  Filled 2016-12-09: qty 20

## 2016-12-09 MED ORDER — LIDOCAINE HCL (CARDIAC) 20 MG/ML IV SOLN
INTRAVENOUS | Status: DC | PRN
  Administered 2016-12-09: 60 mg via INTRAVENOUS

## 2016-12-09 MED ORDER — PROPOFOL 10 MG/ML IV BOLUS
INTRAVENOUS | Status: DC | PRN
  Administered 2016-12-09: 200 mg via INTRAVENOUS

## 2016-12-09 MED ORDER — SUGAMMADEX SODIUM 200 MG/2ML IV SOLN
INTRAVENOUS | Status: DC | PRN
  Administered 2016-12-09: 250 mg via INTRAVENOUS

## 2016-12-09 MED ORDER — PROMETHAZINE HCL 25 MG/ML IJ SOLN: 6.2500 mg | INTRAMUSCULAR | Status: DC | PRN

## 2016-12-09 MED ORDER — HYDROMORPHONE HCL 1 MG/ML IJ SOLN
INTRAMUSCULAR | Status: AC
  Filled 2016-12-09: qty 0.5

## 2016-12-09 MED ORDER — DEXTROSE 5 % IV SOLN
3.0000 g | INTRAVENOUS | Status: AC
  Administered 2016-12-09: 3 g via INTRAVENOUS

## 2016-12-09 MED ORDER — BUPIVACAINE-EPINEPHRINE 0.25% -1:200000 IJ SOLN
INTRAMUSCULAR | Status: AC
  Filled 2016-12-09: qty 2

## 2016-12-09 MED ORDER — ONDANSETRON HCL 4 MG/2ML IJ SOLN
INTRAMUSCULAR | Status: DC | PRN
  Administered 2016-12-09: 4 mg via INTRAVENOUS

## 2016-12-09 MED ORDER — CEFAZOLIN SODIUM-DEXTROSE 1-4 GM/50ML-% IV SOLN
INTRAVENOUS | Status: AC
  Filled 2016-12-09: qty 50

## 2016-12-09 MED ORDER — ONDANSETRON HCL 4 MG/2ML IJ SOLN
INTRAMUSCULAR | Status: AC
  Filled 2016-12-09: qty 2

## 2016-12-09 MED ORDER — EPHEDRINE 5 MG/ML INJ
INTRAVENOUS | Status: AC
  Filled 2016-12-09: qty 10

## 2016-12-09 MED ORDER — HYDROCODONE-ACETAMINOPHEN 7.5-325 MG PO TABS: 1.0000 | ORAL_TABLET | Freq: Once | ORAL | Status: DC | PRN

## 2016-12-09 MED ORDER — ROCURONIUM BROMIDE 100 MG/10ML IV SOLN
INTRAVENOUS | Status: DC | PRN
  Administered 2016-12-09: 50 mg via INTRAVENOUS

## 2016-12-09 MED ORDER — DEXAMETHASONE SODIUM PHOSPHATE 10 MG/ML IJ SOLN
INTRAMUSCULAR | Status: AC
  Filled 2016-12-09: qty 1

## 2016-12-09 MED ORDER — IBUPROFEN 800 MG PO TABS: 800.0000 mg | ORAL_TABLET | Freq: Three times a day (TID) | ORAL | 0 refills | Status: AC | PRN

## 2016-12-09 MED ORDER — SUGAMMADEX SODIUM 500 MG/5ML IV SOLN
INTRAVENOUS | Status: AC
  Filled 2016-12-09: qty 5

## 2016-12-09 MED ORDER — OXYCODONE HCL 5 MG PO TABS
5.0000 mg | ORAL_TABLET | Freq: Once | ORAL | Status: AC | PRN
  Administered 2016-12-09: 5 mg via ORAL

## 2016-12-09 SURGICAL SUPPLY — 27 items
BLADE CLIPPER SURG (BLADE) ×4 IMPLANT
CHLORAPREP W/TINT 26ML (MISCELLANEOUS) ×4 IMPLANT
COVER BACK TABLE 60X90IN (DRAPES) ×4 IMPLANT
DERMABOND ADVANCED (GAUZE/BANDAGES/DRESSINGS) ×2
DERMABOND ADVANCED .7 DNX12 (GAUZE/BANDAGES/DRESSINGS) ×2 IMPLANT
DRAPE LAPAROSCOPIC ABDOMINAL (DRAPES) ×4 IMPLANT
ELECT COATED BLADE 2.86 ST (ELECTRODE) ×4 IMPLANT
ELECT REM PT RETURN 9FT ADLT (ELECTROSURGICAL) ×4
ELECTRODE REM PT RTRN 9FT ADLT (ELECTROSURGICAL) ×2 IMPLANT
GLOVE BIO SURGEON STRL SZ8 (GLOVE) ×4 IMPLANT
GLOVE BIOGEL PI IND STRL 8 (GLOVE) ×2 IMPLANT
GLOVE BIOGEL PI INDICATOR 8 (GLOVE) ×2
GOWN STRL REUS W/ TWL LRG LVL3 (GOWN DISPOSABLE) ×2 IMPLANT
GOWN STRL REUS W/ TWL XL LVL3 (GOWN DISPOSABLE) ×2 IMPLANT
GOWN STRL REUS W/TWL LRG LVL3 (GOWN DISPOSABLE) ×2
GOWN STRL REUS W/TWL XL LVL3 (GOWN DISPOSABLE) ×2
MESH VENTRALEX ST 1-7/10 CRC S (Mesh General) ×4 IMPLANT
NEEDLE HYPO 25X1 1.5 SAFETY (NEEDLE) ×4 IMPLANT
NS IRRIG 1000ML POUR BTL (IV SOLUTION) ×4 IMPLANT
PACK BASIN DAY SURGERY FS (CUSTOM PROCEDURE TRAY) ×4 IMPLANT
PENCIL BUTTON HOLSTER BLD 10FT (ELECTRODE) ×4 IMPLANT
SPONGE LAP 4X18 X RAY DECT (DISPOSABLE) ×4 IMPLANT
SUT MON AB 4-0 PC3 18 (SUTURE) ×8 IMPLANT
SUT NOVA 1 T20/GS 25DT (SUTURE) ×8 IMPLANT
SUT VICRYL 3-0 CR8 SH (SUTURE) ×4 IMPLANT
SYR CONTROL 10ML LL (SYRINGE) ×4 IMPLANT
TOWEL OR 17X24 6PK STRL BLUE (TOWEL DISPOSABLE) ×4 IMPLANT

## 2016-12-09 NOTE — Interval H&P Note (Signed)
History and Physical Interval Note:  12/09/2016 8:50 AM  Matthew DibbleNicholas A Bush  has presented today for surgery, with the diagnosis of VENTRAL HERNIA  The various methods of treatment have been discussed with the patient and family. After consideration of risks, benefits and other options for treatment, the patient has consented to  Ventral hernia repair with mesh  as a surgical intervention .  The patient's history has been reviewed, patient examined, no change in status, stable for surgery.  I have reviewed the patient's chart and labs.  Questions were answered to the patient's satisfaction.     [name]

## 2016-12-09 NOTE — Discharge Instructions (Signed)
°Post Anesthesia Home Care Instructions ° °Activity: °Get plenty of rest for the remainder of the day. A responsible individual must stay with you for 24 hours following the procedure.  °For the next 24 hours, DO NOT: °-Drive a car °-Operate machinery °-Drink alcoholic beverages °-Take any medication unless instructed by your physician °-Make any legal decisions or sign important papers. ° °Meals: °Start with liquid foods such as gelatin or soup. Progress to regular foods as tolerated. Avoid greasy, spicy, heavy foods. If nausea and/or vomiting occur, drink only clear liquids until the nausea and/or vomiting subsides. Call your physician if vomiting continues. ° °Special Instructions/Symptoms: °Your throat may feel dry or sore from the anesthesia or the breathing tube placed in your throat during surgery. If this causes discomfort, gargle with warm salt water. The discomfort should disappear within 24 hours. ° °If you had a scopolamine patch placed behind your ear for the management of post- operative nausea and/or vomiting: ° °1. The medication in the patch is effective for 72 hours, after which it should be removed.  Wrap patch in a tissue and discard in the trash. Wash hands thoroughly with soap and water. °2. You may remove the patch earlier than 72 hours if you experience unpleasant side effects which may include dry mouth, dizziness or visual disturbances. °3. Avoid touching the patch. Wash your hands with soap and water after contact with the patch. °  ° ° ° ° °[facility] _______[facility] ° °UMBILICAL OR INGUINAL HERNIA REPAIR: POST OP INSTRUCTIONS ° °Always review your discharge instruction sheet given to you by the facility where your surgery was performed. °IF YOU HAVE DISABILITY OR FAMILY LEAVE FORMS, YOU MUST BRING THEM TO THE OFFICE FOR PROCESSING.   °DO NOT GIVE THEM TO YOUR DOCTOR. ° °1. A  prescription for pain medication may be given to you upon discharge.  Take your pain medication as  prescribed, if needed.  If narcotic pain medicine is not needed, then you may take acetaminophen (Tylenol) or ibuprofen (Advil) as needed. °2. Take your usually prescribed medications unless otherwise directed. °If you need a refill on your pain medication, please contact your pharmacy.  They will contact our office to request authorization. Prescriptions will not be filled after 5 pm or on week-ends. °3. You should follow a light diet the first 24 hours after arrival home, such as soup and crackers, etc.  Be sure to include lots of fluids daily.  Resume your normal diet the day after surgery. °4.Most patients will experience some swelling and bruising around the umbilicus or in the groin and scrotum.  Ice packs and reclining will help.  Swelling and bruising can take several days to resolve.  °6. It is common to experience some constipation if taking pain medication after surgery.  Increasing fluid intake and taking a stool softener (such as Colace) will usually help or prevent this problem from occurring.  A mild laxative (Milk of Magnesia or Miralax) should be taken according to package directions if there are no bowel movements after 48 hours. °7. Unless discharge instructions indicate otherwise, you may remove your bandages 24-48 hours after surgery, and you may shower at that time.  You may have steri-strips (small skin tapes) in place directly over the incision.  These strips should be left on the skin for 7-10 days.  If your surgeon used skin glue on the incision, you may shower in 24 hours.  The glue will flake off over the next 2-3 weeks.    Any sutures or staples will be removed at the office during your follow-up visit. °8. ACTIVITIES:  You may resume regular (light) daily activities beginning the next day--such as daily self-care, walking, climbing stairs--gradually increasing activities as tolerated.  You may have sexual intercourse when it is comfortable.  Refrain from any heavy lifting or straining  until approved by your doctor. ° °a.You may drive when you are no longer taking prescription pain medication, you can comfortably wear a seatbelt, and you can safely maneuver your car and apply brakes. °b.RETURN TO WORK:   °_____________________________________________ ° °9.You should see your doctor in the office for a follow-up appointment approximately 2-3 weeks after your surgery.  Make sure that you call for this appointment within a day or two after you arrive home to insure a convenient appointment time. °10.OTHER INSTRUCTIONS: _________________________ °   _____________________________________ ° °WHEN TO CALL YOUR DOCTOR: °1. Fever over 101.0 °2. Inability to urinate °3. Nausea and/or vomiting °4. Extreme swelling or bruising °5. Continued bleeding from incision. °6. Increased pain, redness, or drainage from the incision ° °The clinic staff is available to answer your questions during regular business hours.  Please don’t hesitate to call and ask to speak to one of the nurses for clinical concerns.  If you have a medical emergency, go to the nearest emergency room or call 911.  A surgeon from [facility] is always on call at the hospital ° ° °[address], [address], [place], [place]  [address] ? ° [address] [address], [place], [place]   [address] °[phone] ? [phone] ? FAX [phone] °Web site: [email or web address] °

## 2016-12-09 NOTE — H&P (Signed)
Matthew DibbleNicholas A Bush  [facility] Patient #: [id number] DOB: 02/16/1986 Married / Language: Undefined / Race: Black or African American Male   History of Present Illness [name](Yona Kosek A. Anita Mcadory MD;  Patient words: Patient sent at the request of Dr. Erling ConteSwain for a fold just above his umbilicus. It was noticed 2 days ago. He sought medical care was felt to have a reducible ventral hernia. It is sore causing mild to moderate discomfort with exertion. Location of pain is above the umbilicus. There is no radiation. It is made worse with lifting or pushing. It is a 4-5 out of 10 with exertion. No nausea or vomiting. No change in bowel habits.  The patient is a 31 year old male who presents with an umbilical hernia. This hernia is thought to be acquired.   Past Surgical History [name](Diane Kahny, RN;  Oral Surgery   Diagnostic Studies History [name](Diane Kahny, RN; Colonoscopy  never  Allergies [name](Diane Kahny, RN;  No Known Drug Allergies Medication History [name](Diane Kahny, RN; Escitalopram Oxalate (10MG  Tablet, Oral) Active. AmLODIPine Besylate (10MG  Tablet, Oral) Active. Losartan Potassium-HCTZ (100-25MG  Tablet, Oral) Active. Medications Reconciled  Other Problems [name](Diane Kahny, RN;  Anxiety Disorder  High blood pressure  Kidney Stone     Review of Systems (Diane AMR CorporationKahny RN General Not Present- Appetite Loss, Chills, Fatigue, Fever, Night Sweats, Weight Gain and Weight Loss. Skin Not Present- Change in Wart/Mole, Dryness, Hives, Jaundice, New Lesions, Non-Healing Wounds, Rash and Ulcer. HEENT Not Present- Earache, Hearing Loss, Hoarseness, Nose Bleed, Oral Ulcers, Ringing in the Ears, Seasonal Allergies, Sinus Pain, Sore Throat, Visual Disturbances, Wears glasses/contact lenses and Yellow Eyes. Respiratory Not Present- Bloody sputum, Chronic Cough, Difficulty Breathing, Snoring and Wheezing. Breast Not Present- Breast Mass, Breast Pain, Nipple Discharge and Skin  Changes. Cardiovascular Not Present- Chest Pain, Difficulty Breathing Lying Down, Leg Cramps, Palpitations, Rapid Heart Rate, Shortness of Breath and Swelling of Extremities. Gastrointestinal Present- Abdominal Pain. Not Present- Bloating, Bloody Stool, Change in Bowel Habits, Chronic diarrhea, Constipation, Difficulty Swallowing, Excessive gas, Gets full quickly at meals, Hemorrhoids, Indigestion, Nausea, Rectal Pain and Vomiting. Male Genitourinary Not Present- Blood in Urine, Change in Urinary Stream, Frequency, Impotence, Nocturia, Painful Urination, Urgency and Urine Leakage. Musculoskeletal Not Present- Back Pain, Joint Pain, Joint Stiffness, Muscle Pain, Muscle Weakness and Swelling of Extremities. Neurological Not Present- Decreased Memory, Fainting, Headaches, Numbness, Seizures, Tingling, Tremor, Trouble walking and Weakness. Psychiatric Present- Anxiety. Not Present- Bipolar, Change in Sleep Pattern, Depression, Fearful and Frequent crying. Endocrine Not Present- Cold Intolerance, Excessive Hunger, Hair Changes, Heat Intolerance, Hot flashes and New Diabetes. Hematology Not Present- Blood Thinners, Easy Bruising, Excessive bleeding, Gland problems, HIV and Persistent Infections.  Vitals (Diane [employer] CorporationKahny RN Weight: 272.38 lb Height: 67in Height was reported by patient. Body Surface Area: 2.31 m Body Mass Index: 42.66 kg/m  Temp.: 16F(Oral)  Pulse: 60 (Regular)  P.OX: 99% (Room air) BP: 148/88 (Sitting, Left Arm, Standard)       Physical Exam (Emilynn Srinivasan A. Antwyne Pingree MD; General Mental Status-Alert. General Appearance-Consistent with stated age. Hydration-Well hydrated. Voice-Normal.  Head and Neck Head-normocephalic, atraumatic with no lesions or palpable masses. Trachea-midline. Thyroid Gland Characteristics - normal size and consistency.  Eye Eyeball - Bilateral-Extraocular movements intact. Sclera/Conjunctiva - Bilateral-No scleral  icterus.  Chest and Lung Exam Chest and lung exam reveals -quiet, even and easy respiratory effort with no use of accessory muscles and on auscultation, normal breath sounds, no adventitious sounds and normal vocal resonance. Inspection Chest Wall - Normal. Back -  normal.  Cardiovascular Cardiovascular examination reveals -normal heart sounds, regular rate and rhythm with no murmurs and normal pedal pulses bilaterally.  Abdomen Note: Reducible ventral hernia just above the umbilicus measuring 3 cm.   Musculoskeletal Normal Exam - Left-Upper Extremity Strength Normal and Lower Extremity Strength Normal. Normal Exam - Right-Upper Extremity Strength Normal and Lower Extremity Strength Normal.    Assessment & Plan (Jamyiah Labella A. Shayli Altemose MD; VENTRAL HERNIA WITHOUT OBSTRUCTION OR GANGRENE (K43.9) Impression: Discuss repair options with mesh. Risks, benefits and all traditional surgery were discussed today. He desires repair of this. The risk of hernia repair include bleeding, infection, organ injury, bowel injury, bladder injury, nerve injury recurrent hernia, blood clots, worsening of underlying condition, chronic pain, mesh use, open surgery, death, and the need for other operattions. Pt agrees to proceed Current Plans You are being scheduled for surgery- Our schedulers will call you.  You should hear from our office's scheduling department within 5 working days about the location, date, and time of surgery. We try to make accommodations for patient's preferences in scheduling surgery, but sometimes the OR schedule or the surgeon's schedule prevents [place] from making those accommodations.  If you have not heard from our office [phone]) in 5 working days, call the office and ask for your surgeon's nurse.  If you have other questions about your diagnosis, plan, or surgery, call the office and ask for your surgeon's nurse.  Pt Education - Pamphlet Given - Hernia  Surgery: discussed with patient and provided information. The anatomy & physiology of the abdominal wall was discussed. The pathophysiology of hernias was discussed. Natural history risks without surgery including progeressive enlargement, pain, incarceration, & strangulation was discussed. Contributors to complications such as smoking, obesity, diabetes, prior surgery, etc were discussed.  I feel the risks of no intervention will lead to serious problems that outweigh the operative risks; therefore, I recommended surgery to reduce and repair the hernia. I explained laparoscopic techniques with possible need for an open approach. I noted the probable use of mesh to patch and/or buttress the hernia repair  Risks such as bleeding, infection, abscess, need for further treatment, heart attack, death, and other risks were discussed. I noted a good likelihood this will help address the problem. Goals of post-operative recovery were discussed as well. Possibility that this will not correct all symptoms was explained. I stressed the importance of low-impact activity, aggressive pain control, avoiding constipation, & not pushing through pain to minimize risk of post-operative chronic pain or injury. Possibility of reherniation especially with smoking, obesity, diabetes, immunosuppression, and other health conditions was discussed. We will work to minimize complications.  An educational handout further explaining the pathology & treatment options was given as well. Questions were answered. The patient expresses understanding & wishes to proceed with surgery.    Signed by Dortha Schwalbe, MD

## 2016-12-09 NOTE — Anesthesia Preprocedure Evaluation (Signed)
Anesthesia Evaluation  Patient identified by MRN, date of birth, ID band Patient awake    Reviewed: Allergy & Precautions, NPO status , Patient's Chart, lab work & pertinent test results  Airway Mallampati: II  TM Distance: >3 FB Neck ROM: Full    Dental no notable dental hx. (+) Teeth Intact   Pulmonary Current Smoker,    Pulmonary exam normal breath sounds clear to auscultation       Cardiovascular hypertension, Pt. on medications Normal cardiovascular exam Rhythm:Regular Rate:Normal     Neuro/Psych  Headaches, negative psych ROS   GI/Hepatic Neg liver ROS, GERD  ,  Endo/Other  Morbid obesity  Renal/GU negative Renal ROS  negative genitourinary   Musculoskeletal Ventral hernia   Abdominal (+) + obese,   Peds  Hematology negative hematology ROS (+)   Anesthesia Other Findings   Reproductive/Obstetrics                             Anesthesia Physical Anesthesia Plan  ASA: III  Anesthesia Plan: General   Post-op Pain Management:    Induction: Intravenous  PONV Risk Score and Plan: 3 and Ondansetron, Dexamethasone, Midazolam, Propofol infusion and Treatment may vary due to age or medical condition  Airway Management Planned: Oral ETT  Additional Equipment:   Intra-op Plan:   Post-operative Plan: Extubation in OR  Informed Consent: I have reviewed the patients History and Physical, chart, labs and discussed the procedure including the risks, benefits and alternatives for the proposed anesthesia with the patient or authorized representative who has indicated his/her understanding and acceptance.   Dental advisory given  Plan Discussed with: CRNA, Anesthesiologist and Surgeon  Anesthesia Plan Comments:         Anesthesia Quick Evaluation

## 2016-12-09 NOTE — Op Note (Signed)
Preoperative diagnosis: Ventral hernia 2 cm with chronic incarceration  Postoperative diagnosis: Same  Procedure: Repair of ventral hernia with mesh  Surgeon: Erroll Luna M.D.  Anesthesia: Gen. with 0.25% Sensorcaine local with epinephrine  EBL: Minimal  Specimens: None  Drains: None  Indications for procedure: Patient presents for repair of a painful ventral hernia located just above his umbilicus. Risks, benefits and alternatives to surgery were discussed with the patient. The use of mesh was discussed with the patient and the pros and cons of that. After discussion of repair he was to proceed due to pain.The risk of hernia repair include bleeding,  Infection,   Recurrence of the hernia,  Mesh use, chronic pain,  Organ injury,  Bowel injury,  Bladder injury,   nerve injury with numbness around the incision,  Death,  and worsening of preexisting  medical problems.  The alternatives to surgery have been discussed as well..  Long term expectations of both operative and non operative treatments have been discussed.   The patient agrees to proceed.  Description of procedure: The patient was met in the holding area reexamined. A history and physical for updated and questions were answered. The area was marked with the assistance of the patient. He is taken the operating room and placed supine. After induction of general anesthesia, the abdomen was prepped in sterile fashion. Timeout was done he received 3 g of Ancef. A 4 cm incision was made above the umbilicus. Dissection was carried down hernia sac was found. This was dissected off the fascial edges to reduce back in the preperitoneal space. He a small piece of preperitoneal fat incarcerated within the 2 cm hernia defect. This was repaired with a 4.3 cm circular ventral X mesh. This was secured with 2-0 Novafil. Fascia was closed with 2 Novafil over the mesh. Irrigation was used. Hemostasis achieved with cautery. Wound closed deep layer of 3-0  Vicryl and 4-0 Monocryl subcuticular stitch. Dermabond applied. All final counts are found to be correct. The patient was awoke extubated taken to recovery in satisfactory condition.

## 2016-12-09 NOTE — Transfer of Care (Signed)
Immediate Anesthesia Transfer of Care Note  Patient: Matthew Bush  Procedure(s) Performed: Procedure(s): VENTRAL HERNIA REPAIR WITH MESH (N/A) INSERTION OF MESH (N/A)  Patient Location: PACU  Anesthesia Type:General  Level of Consciousness: awake, alert , oriented and patient cooperative  Airway & Oxygen Therapy: Patient Spontanous Breathing and Patient connected to nasal cannula oxygen  Post-op Assessment: Report given to RN and Post -op Vital signs reviewed and stable  Post vital signs: Reviewed and stable  Last Vitals:  Vitals:   12/09/16 0729  BP: 123/61  Pulse: (!) 57  Resp: 18  Temp: 36.8 C  SpO2: 100%    Last Pain:  Vitals:   12/09/16 0729  TempSrc: Oral      Patients Stated Pain Goal: 0 (12/09/16 0729)  Complications: No apparent anesthesia complications

## 2016-12-09 NOTE — Anesthesia Procedure Notes (Signed)
Procedure Name: Intubation Date/Time: 12/09/2016 9:25 AM Performed by: Oletta Lamas Pre-anesthesia Checklist: Patient identified, Emergency Drugs available, Suction available and Patient being monitored Patient Re-evaluated:Patient Re-evaluated prior to induction Oxygen Delivery Method: Circle System Utilized Preoxygenation: Pre-oxygenation with 100% oxygen Induction Type: IV induction Ventilation: Mask ventilation without difficulty Laryngoscope Size: Mac and 4 Grade View: Grade I Tube type: Oral Tube size: 7.0 mm Number of attempts: 1 Airway Equipment and Method: Stylet Placement Confirmation: ETT inserted through vocal cords under direct vision,  positive ETCO2 and breath sounds checked- equal and bilateral Secured at: 23 cm Tube secured with: Tape Dental Injury: Teeth and Oropharynx as per pre-operative assessment

## 2016-12-09 NOTE — Anesthesia Postprocedure Evaluation (Signed)
Anesthesia Post Note  Patient: Miguel Dibbleicholas A Brazell  Procedure(s) Performed: Procedure(s) (LRB): VENTRAL HERNIA REPAIR WITH MESH (N/A) INSERTION OF MESH (N/A)     Patient location during evaluation: PACU Anesthesia Type: General Level of consciousness: awake and alert Pain management: pain level controlled Vital Signs Assessment: post-procedure vital signs reviewed and stable Respiratory status: spontaneous breathing, nonlabored ventilation and respiratory function stable Cardiovascular status: blood pressure returned to baseline and stable Postop Assessment: no signs of nausea or vomiting Anesthetic complications: no    Last Vitals:  Vitals:   12/09/16 1100 12/09/16 1118  BP: (!) 146/71 132/67  Pulse: 76 72  Resp: 17 18  Temp:  37 C  SpO2: 97% 98%    Last Pain:  Vitals:   12/09/16 1100  TempSrc:   PainSc: 8                  Dyasia Firestine A.

## 2016-12-10 ENCOUNTER — Encounter (HOSPITAL_BASED_OUTPATIENT_CLINIC_OR_DEPARTMENT_OTHER): Payer: Self-pay | Admitting: Surgery

## 2016-12-10 NOTE — Addendum Note (Signed)
Addendum  created 12/10/16 0654 by Lance CoonWebster, [place], CRNA   Charge Capture section accepted

## 2016-12-17 ENCOUNTER — Encounter (HOSPITAL_BASED_OUTPATIENT_CLINIC_OR_DEPARTMENT_OTHER): Payer: Self-pay | Admitting: *Deleted

## 2016-12-23 NOTE — Anesthesia Preprocedure Evaluation (Signed)
Anesthesia Evaluation  Patient identified by MRN, date of birth, ID band Patient awake    Reviewed: Allergy & Precautions, NPO status , Patient's Chart, lab work & pertinent test results  Airway Mallampati: II  TM Distance: >3 FB Neck ROM: Full    Dental no notable dental hx. (+) Teeth Intact   Pulmonary Current Smoker,    Pulmonary exam normal breath sounds clear to auscultation       Cardiovascular hypertension, Pt. on medications Normal cardiovascular exam Rhythm:Regular Rate:Normal     Neuro/Psych  Headaches, negative psych ROS   GI/Hepatic Neg liver ROS, GERD  Medicated,  Endo/Other  Morbid obesity  Renal/GU negative Renal ROS  negative genitourinary   Musculoskeletal Ventral hernia   Abdominal (+) + obese,   Peds  Hematology negative hematology ROS (+)   Anesthesia Other Findings   Reproductive/Obstetrics                             Anesthesia Physical  Anesthesia Plan  ASA: III  Anesthesia Plan: General   Post-op Pain Management:    Induction: Intravenous  PONV Risk Score and Plan: 3 and Ondansetron, Dexamethasone, Midazolam, Treatment may vary due to age or medical condition and Scopolamine patch - Pre-op  Airway Management Planned: Oral ETT  Additional Equipment:   Intra-op Plan:   Post-operative Plan: Extubation in OR  Informed Consent: I have reviewed the patients History and Physical, chart, labs and discussed the procedure including the risks, benefits and alternatives for the proposed anesthesia with the patient or authorized representative who has indicated his/her understanding and acceptance.   Dental advisory given  Plan Discussed with: CRNA, Anesthesiologist and Surgeon  Anesthesia Plan Comments:         Anesthesia Quick Evaluation

## 2016-12-24 ENCOUNTER — Ambulatory Visit (HOSPITAL_BASED_OUTPATIENT_CLINIC_OR_DEPARTMENT_OTHER)
Admission: RE | Admit: 2016-12-24 | Discharge: 2016-12-24 | Disposition: A | Discharge status: Home / Self Care | Payer: 59 | Source: Ambulatory Visit | Attending: Otolaryngology | Admitting: Otolaryngology

## 2016-12-24 ENCOUNTER — Encounter (HOSPITAL_BASED_OUTPATIENT_CLINIC_OR_DEPARTMENT_OTHER): Payer: Self-pay | Admitting: *Deleted

## 2016-12-24 ENCOUNTER — Encounter (HOSPITAL_BASED_OUTPATIENT_CLINIC_OR_DEPARTMENT_OTHER)
Admission: RE | Disposition: A | Discharge status: Home / Self Care | Payer: Self-pay | Source: Ambulatory Visit | Attending: Otolaryngology

## 2016-12-24 ENCOUNTER — Ambulatory Visit (HOSPITAL_BASED_OUTPATIENT_CLINIC_OR_DEPARTMENT_OTHER): Payer: 59 | Admitting: Anesthesiology

## 2016-12-24 DIAGNOSIS — J329 Chronic sinusitis, unspecified: Secondary | ICD-10-CM | POA: Diagnosis not present

## 2016-12-24 DIAGNOSIS — J343 Hypertrophy of nasal turbinates: Secondary | ICD-10-CM | POA: Insufficient documentation

## 2016-12-24 DIAGNOSIS — I10 Essential (primary) hypertension: Secondary | ICD-10-CM | POA: Diagnosis not present

## 2016-12-24 DIAGNOSIS — J342 Deviated nasal septum: Secondary | ICD-10-CM | POA: Diagnosis present

## 2016-12-24 DIAGNOSIS — Z79899 Other long term (current) drug therapy: Secondary | ICD-10-CM | POA: Diagnosis not present

## 2016-12-24 DIAGNOSIS — F17290 Nicotine dependence, other tobacco product, uncomplicated: Secondary | ICD-10-CM | POA: Insufficient documentation

## 2016-12-24 DIAGNOSIS — J320 Chronic maxillary sinusitis: Secondary | ICD-10-CM | POA: Diagnosis not present

## 2016-12-24 DIAGNOSIS — J3489 Other specified disorders of nose and nasal sinuses: Secondary | ICD-10-CM | POA: Diagnosis not present

## 2016-12-24 DIAGNOSIS — K219 Gastro-esophageal reflux disease without esophagitis: Secondary | ICD-10-CM | POA: Insufficient documentation

## 2016-12-24 DIAGNOSIS — J321 Chronic frontal sinusitis: Secondary | ICD-10-CM | POA: Diagnosis not present

## 2016-12-24 DIAGNOSIS — Z6841 Body Mass Index (BMI) 40.0 and over, adult: Secondary | ICD-10-CM | POA: Insufficient documentation

## 2016-12-24 DIAGNOSIS — J322 Chronic ethmoidal sinusitis: Secondary | ICD-10-CM | POA: Diagnosis not present

## 2016-12-24 HISTORY — PX: SINUS ENDO WITH FUSION: SHX5329

## 2016-12-24 HISTORY — PX: MAXILLARY ANTROSTOMY: SHX2003

## 2016-12-24 HISTORY — PX: NASAL SEPTOPLASTY W/ TURBINOPLASTY: SHX2070

## 2016-12-24 HISTORY — PX: ENDOSCOPIC CONCHA BULLOSA RESECTION: SHX6395

## 2016-12-24 SURGERY — SEPTOPLASTY, NOSE, WITH NASAL TURBINATE REDUCTION
Anesthesia: General | Site: Nose | Laterality: Left

## 2016-12-24 MED ORDER — MIDAZOLAM HCL 2 MG/2ML IJ SOLN
1.0000 mg | INTRAMUSCULAR | Status: DC | PRN
  Administered 2016-12-24: 2 mg via INTRAVENOUS

## 2016-12-24 MED ORDER — OXYCODONE HCL 5 MG PO TABS
5.0000 mg | ORAL_TABLET | Freq: Once | ORAL | Status: AC | PRN
  Administered 2016-12-24: 5 mg via ORAL

## 2016-12-24 MED ORDER — PROPOFOL 10 MG/ML IV BOLUS
INTRAVENOUS | Status: DC | PRN
  Administered 2016-12-24: 200 mg via INTRAVENOUS

## 2016-12-24 MED ORDER — LIDOCAINE 2% (20 MG/ML) 5 ML SYRINGE
INTRAMUSCULAR | Status: DC | PRN
  Administered 2016-12-24: 60 mg via INTRAVENOUS

## 2016-12-24 MED ORDER — HYDROCODONE-ACETAMINOPHEN 5-325 MG PO TABS: 1.0000 | ORAL_TABLET | Freq: Four times a day (QID) | ORAL | 0 refills | Status: DC | PRN

## 2016-12-24 MED ORDER — FENTANYL CITRATE (PF) 100 MCG/2ML IJ SOLN
INTRAMUSCULAR | Status: AC
  Filled 2016-12-24: qty 2

## 2016-12-24 MED ORDER — DEXAMETHASONE SODIUM PHOSPHATE 10 MG/ML IJ SOLN
INTRAMUSCULAR | Status: AC
  Filled 2016-12-24: qty 1

## 2016-12-24 MED ORDER — MIDAZOLAM HCL 2 MG/2ML IJ SOLN
INTRAMUSCULAR | Status: AC
Start: 2016-12-24 — End: ?
  Filled 2016-12-24: qty 2

## 2016-12-24 MED ORDER — DEXTROSE 5 % IV SOLN
3.0000 g | INTRAVENOUS | Status: AC
  Administered 2016-12-24: 3 g via INTRAVENOUS

## 2016-12-24 MED ORDER — CHLORHEXIDINE GLUCONATE CLOTH 2 % EX PADS: 6.0000 | MEDICATED_PAD | Freq: Once | CUTANEOUS | Status: DC

## 2016-12-24 MED ORDER — TRIAMCINOLONE ACETONIDE 40 MG/ML IJ SUSP
INTRAMUSCULAR | Status: AC
  Filled 2016-12-24: qty 5

## 2016-12-24 MED ORDER — OXYCODONE HCL 5 MG PO TABS
ORAL_TABLET | ORAL | Status: AC
  Filled 2016-12-24: qty 1

## 2016-12-24 MED ORDER — HYDROMORPHONE HCL 1 MG/ML IJ SOLN
INTRAMUSCULAR | Status: AC
  Filled 2016-12-24: qty 0.5

## 2016-12-24 MED ORDER — ACETAMINOPHEN 10 MG/ML IV SOLN
1000.0000 mg | Freq: Once | INTRAVENOUS | Status: AC | PRN
  Administered 2016-12-24: 1000 mg via INTRAVENOUS

## 2016-12-24 MED ORDER — LACTATED RINGERS IV SOLN
INTRAVENOUS | Status: DC
  Administered 2016-12-24 (×2): via INTRAVENOUS

## 2016-12-24 MED ORDER — AMOXICILLIN-POT CLAVULANATE 500-125 MG PO TABS: 1.0000 | ORAL_TABLET | Freq: Two times a day (BID) | ORAL | 0 refills | Status: DC

## 2016-12-24 MED ORDER — FENTANYL CITRATE (PF) 100 MCG/2ML IJ SOLN
50.0000 ug | INTRAMUSCULAR | Status: DC | PRN
  Administered 2016-12-24 (×2): 50 ug via INTRAVENOUS

## 2016-12-24 MED ORDER — ONDANSETRON HCL 4 MG/2ML IJ SOLN: 4.0000 mg | Freq: Once | INTRAMUSCULAR | Status: DC | PRN

## 2016-12-24 MED ORDER — CEFAZOLIN SODIUM 1 G IJ SOLR
INTRAMUSCULAR | Status: AC
  Filled 2016-12-24: qty 20

## 2016-12-24 MED ORDER — CEFAZOLIN SODIUM-DEXTROSE 2-4 GM/100ML-% IV SOLN
INTRAVENOUS | Status: AC
  Filled 2016-12-24: qty 100

## 2016-12-24 MED ORDER — ACETAMINOPHEN 10 MG/ML IV SOLN
INTRAVENOUS | Status: AC
  Filled 2016-12-24: qty 100

## 2016-12-24 MED ORDER — OXYMETAZOLINE HCL 0.05 % NA SOLN
NASAL | Status: DC | PRN
  Administered 2016-12-24: 1 via TOPICAL

## 2016-12-24 MED ORDER — SUCCINYLCHOLINE CHLORIDE 20 MG/ML IJ SOLN
INTRAMUSCULAR | Status: DC | PRN
  Administered 2016-12-24: 120 mg via INTRAVENOUS

## 2016-12-24 MED ORDER — GLYCOPYRROLATE 0.2 MG/ML IJ SOLN
INTRAMUSCULAR | Status: DC | PRN
  Administered 2016-12-24: 0.2 mg via INTRAVENOUS

## 2016-12-24 MED ORDER — SUCCINYLCHOLINE CHLORIDE 200 MG/10ML IV SOSY
PREFILLED_SYRINGE | INTRAVENOUS | Status: AC
  Filled 2016-12-24: qty 10

## 2016-12-24 MED ORDER — LIDOCAINE-EPINEPHRINE 1 %-1:100000 IJ SOLN
INTRAMUSCULAR | Status: AC
  Filled 2016-12-24: qty 1

## 2016-12-24 MED ORDER — KETOROLAC TROMETHAMINE 30 MG/ML IJ SOLN
30.0000 mg | Freq: Once | INTRAMUSCULAR | Status: AC | PRN
  Administered 2016-12-24: 30 mg via INTRAVENOUS

## 2016-12-24 MED ORDER — SCOPOLAMINE 1 MG/3DAYS TD PT72: 1.0000 | MEDICATED_PATCH | Freq: Once | TRANSDERMAL | Status: DC | PRN

## 2016-12-24 MED ORDER — DEXAMETHASONE SODIUM PHOSPHATE 10 MG/ML IJ SOLN: 10.0000 mg | Freq: Once | INTRAMUSCULAR | Status: DC

## 2016-12-24 MED ORDER — OXYMETAZOLINE HCL 0.05 % NA SOLN
1.0000 | Freq: Two times a day (BID) | NASAL | Status: DC
  Administered 2016-12-24: 1 via NASAL

## 2016-12-24 MED ORDER — MIDAZOLAM HCL 2 MG/2ML IJ SOLN
INTRAMUSCULAR | Status: AC
  Filled 2016-12-24: qty 2

## 2016-12-24 MED ORDER — SUGAMMADEX SODIUM 200 MG/2ML IV SOLN
INTRAVENOUS | Status: AC
  Filled 2016-12-24: qty 2

## 2016-12-24 MED ORDER — KETOROLAC TROMETHAMINE 30 MG/ML IJ SOLN
INTRAMUSCULAR | Status: AC
  Filled 2016-12-24: qty 1

## 2016-12-24 MED ORDER — ROCURONIUM BROMIDE 100 MG/10ML IV SOLN
INTRAVENOUS | Status: DC | PRN
  Administered 2016-12-24: 40 mg via INTRAVENOUS
  Administered 2016-12-24: 20 mg via INTRAVENOUS

## 2016-12-24 MED ORDER — MEPERIDINE HCL 25 MG/ML IJ SOLN: 6.2500 mg | INTRAMUSCULAR | Status: DC | PRN

## 2016-12-24 MED ORDER — LIDOCAINE-EPINEPHRINE 1 %-1:100000 IJ SOLN
INTRAMUSCULAR | Status: DC | PRN
  Administered 2016-12-24: 7 mL

## 2016-12-24 MED ORDER — ONDANSETRON HCL 4 MG/2ML IJ SOLN
INTRAMUSCULAR | Status: AC
  Filled 2016-12-24: qty 2

## 2016-12-24 MED ORDER — FENTANYL CITRATE (PF) 100 MCG/2ML IJ SOLN
25.0000 ug | INTRAMUSCULAR | Status: DC | PRN
  Administered 2016-12-24: 25 ug via INTRAVENOUS
  Administered 2016-12-24: 50 ug via INTRAVENOUS
  Administered 2016-12-24: 25 ug via INTRAVENOUS

## 2016-12-24 MED ORDER — SUGAMMADEX SODIUM 200 MG/2ML IV SOLN
INTRAVENOUS | Status: DC | PRN
  Administered 2016-12-24: 200 mg via INTRAVENOUS

## 2016-12-24 MED ORDER — CEFAZOLIN SODIUM-DEXTROSE 1-4 GM/50ML-% IV SOLN
INTRAVENOUS | Status: AC
  Filled 2016-12-24: qty 50

## 2016-12-24 MED ORDER — FENTANYL CITRATE (PF) 100 MCG/2ML IJ SOLN
100.0000 ug | Freq: Once | INTRAMUSCULAR | Status: AC
  Administered 2016-12-24: 100 ug via INTRAVENOUS

## 2016-12-24 MED ORDER — MUPIROCIN 2 % EX OINT
TOPICAL_OINTMENT | CUTANEOUS | Status: DC | PRN
  Administered 2016-12-24: 1 via NASAL

## 2016-12-24 MED ORDER — HYDROMORPHONE HCL 1 MG/ML IJ SOLN
0.2500 mg | INTRAMUSCULAR | Status: DC | PRN
  Administered 2016-12-24 (×2): 0.5 mg via INTRAVENOUS

## 2016-12-24 MED ORDER — OXYMETAZOLINE HCL 0.05 % NA SOLN
NASAL | Status: AC
Start: 2016-12-24 — End: ?
  Filled 2016-12-24: qty 15

## 2016-12-24 MED ORDER — DEXAMETHASONE SODIUM PHOSPHATE 4 MG/ML IJ SOLN
INTRAMUSCULAR | Status: DC | PRN
  Administered 2016-12-24: 10 mg via INTRAVENOUS

## 2016-12-24 MED ORDER — LIDOCAINE 2% (20 MG/ML) 5 ML SYRINGE
INTRAMUSCULAR | Status: AC
  Filled 2016-12-24: qty 5

## 2016-12-24 SURGICAL SUPPLY — 86 items
ATTRACTOMAT 16X20 MAGNETIC DRP (DRAPES) IMPLANT
BLADE RAD40 ROTATE 4M 4 5PK (BLADE) IMPLANT
BLADE RAD40 ROTATE 4M 4MM 5PK (BLADE)
BLADE RAD60 ROTATE M4 4 5PK (BLADE) IMPLANT
BLADE RAD60 ROTATE M4 4MM 5PK (BLADE)
BLADE ROTATE RAD 12 4 M4 (BLADE) ×3 IMPLANT
BLADE ROTATE RAD 12 4MM M4 (BLADE) ×1
BLADE ROTATE RAD 40 4 M4 (BLADE) IMPLANT
BLADE ROTATE RAD 40 4MM M4 (BLADE)
BLADE ROTATE TRICUT 4MX13CM M4 (BLADE) ×1
BLADE ROTATE TRICUT 4X13 M4 (BLADE) ×3 IMPLANT
BLADE SURG 15 STRL LF DISP TIS (BLADE) IMPLANT
BLADE SURG 15 STRL SS (BLADE)
BLADE TRICUT ROTATE M4 4 5PK (BLADE) ×3 IMPLANT
BLADE TRICUT ROTATE M4 4MM 5PK (BLADE) ×1
BUR HS RAD FRONTAL 3 (BURR) IMPLANT
BUR HS RAD FRONTAL 3MM (BURR)
CANISTER SUC SOCK COL 7IN (MISCELLANEOUS) ×4 IMPLANT
CANISTER SUCT 1200ML W/VALVE (MISCELLANEOUS) ×8 IMPLANT
CATH SINUS BALLN 7X16 (CATHETERS) IMPLANT
CATH SINUS BALLN RELIEV 6X16 (SINUPLASTY) IMPLANT
CATH SINUS GUIDE F-70 (CATHETERS) IMPLANT
CATH SINUS GUIDE M/110 (CATHETERS) IMPLANT
CATH SINUS IRRIGATION 2.0 (CATHETERS) IMPLANT
COAGULATOR SUCT 6 FR SWTCH (ELECTROSURGICAL)
COAGULATOR SUCT 8FR VV (MISCELLANEOUS) IMPLANT
COAGULATOR SUCT SWTCH 10FR 6 (ELECTROSURGICAL) IMPLANT
DECANTER SPIKE VIAL GLASS SM (MISCELLANEOUS) ×4 IMPLANT
DEVICE INFLATION 20/61 (MISCELLANEOUS) IMPLANT
DRAPE SURG 17X23 STRL (DRAPES) ×4 IMPLANT
DRESSING NASAL KENNEDY 3.5X.9 (MISCELLANEOUS) IMPLANT
DRSG NASAL KENNEDY 3.5X.9 (MISCELLANEOUS)
DRSG NASOPORE 8CM (GAUZE/BANDAGES/DRESSINGS) IMPLANT
DRSG TELFA 3X8 NADH (GAUZE/BANDAGES/DRESSINGS) IMPLANT
ELECT COATED BLADE 2.86 ST (ELECTRODE) IMPLANT
ELECT REM PT RETURN 9FT ADLT (ELECTROSURGICAL) ×4
ELECTRODE REM PT RTRN 9FT ADLT (ELECTROSURGICAL) ×2 IMPLANT
GLOVE BIO SURGEON STRL SZ 6.5 (GLOVE) ×3 IMPLANT
GLOVE BIO SURGEON STRL SZ7 (GLOVE) ×8 IMPLANT
GLOVE BIO SURGEONS STRL SZ 6.5 (GLOVE) ×1
GLOVE BIOGEL M 7.0 STRL (GLOVE) ×8 IMPLANT
GLOVE BIOGEL PI IND STRL 7.0 (GLOVE) ×2 IMPLANT
GLOVE BIOGEL PI IND STRL 7.5 (GLOVE) ×2 IMPLANT
GLOVE BIOGEL PI INDICATOR 7.0 (GLOVE) ×2
GLOVE BIOGEL PI INDICATOR 7.5 (GLOVE) ×2
GLOVE SURG SYN 7.5  E (GLOVE) ×2
GLOVE SURG SYN 7.5 E (GLOVE) ×2 IMPLANT
GOWN STRL REUS W/ TWL LRG LVL3 (GOWN DISPOSABLE) ×4 IMPLANT
GOWN STRL REUS W/TWL LRG LVL3 (GOWN DISPOSABLE) ×4
HANDLE SINUS GUIDE LP (INSTRUMENTS) IMPLANT
IV NS 1000ML (IV SOLUTION)
IV NS 1000ML BAXH (IV SOLUTION) IMPLANT
IV NS 500ML (IV SOLUTION) ×2
IV NS 500ML BAXH (IV SOLUTION) ×2 IMPLANT
IV SET EXT 30 76VOL 4 MALE LL (IV SETS) ×4 IMPLANT
NEEDLE PRECISIONGLIDE 27X1.5 (NEEDLE) ×4 IMPLANT
NEEDLE SPNL 25GX3.5 QUINCKE BL (NEEDLE) IMPLANT
NS IRRIG 1000ML POUR BTL (IV SOLUTION) IMPLANT
PACK BASIN DAY SURGERY FS (CUSTOM PROCEDURE TRAY) ×4 IMPLANT
PACK ENT DAY SURGERY (CUSTOM PROCEDURE TRAY) ×4 IMPLANT
PENCIL BUTTON HOLSTER BLD 10FT (ELECTRODE) IMPLANT
PENCIL FOOT CONTROL (ELECTRODE) IMPLANT
SLEEVE SCD COMPRESS KNEE MED (MISCELLANEOUS) ×4 IMPLANT
SOLUTION ANTI FOG 6CC (MISCELLANEOUS) ×4 IMPLANT
SOLUTION BUTLER CLEAR DIP (MISCELLANEOUS) ×4 IMPLANT
SPLINT NASAL AIRWAY SILICONE (MISCELLANEOUS) ×4 IMPLANT
SPONGE GAUZE 2X2 8PLY STER LF (GAUZE/BANDAGES/DRESSINGS) ×1
SPONGE GAUZE 2X2 8PLY STRL LF (GAUZE/BANDAGES/DRESSINGS) ×3 IMPLANT
SPONGE NEURO XRAY DETECT 1X3 (DISPOSABLE) ×4 IMPLANT
SPONGE SURGIFOAM ABS GEL 12-7 (HEMOSTASIS) IMPLANT
SUT CHROMIC 3 0 PS 2 (SUTURE) IMPLANT
SUT ETHILON 3 0 PS 1 (SUTURE) ×4 IMPLANT
SUT PLAIN 4 0 ~~LOC~~ 1 (SUTURE) ×4 IMPLANT
SUT SILK 3 0 PS 1 (SUTURE) IMPLANT
SYR 3ML 23GX1 SAFETY (SYRINGE) IMPLANT
SYSTEM RELIEVA LUMA ILLUM (SINUPLASTY) IMPLANT
TOWEL OR 17X24 6PK STRL BLUE (TOWEL DISPOSABLE) ×8 IMPLANT
TRACKER ENT INSTRUMENT (MISCELLANEOUS) ×4 IMPLANT
TRACKER ENT PATIENT (MISCELLANEOUS) ×4 IMPLANT
TRAY DSU PREP LF (CUSTOM PROCEDURE TRAY) IMPLANT
TUBE CONNECTING 20'X1/4 (TUBING) ×1
TUBE CONNECTING 20X1/4 (TUBING) ×3 IMPLANT
TUBE SALEM SUMP 12R W/ARV (TUBING) IMPLANT
TUBE SALEM SUMP 16 FR W/ARV (TUBING) IMPLANT
TUBING STRAIGHTSHOT EPS 5PK (TUBING) ×4 IMPLANT
YANKAUER SUCT BULB TIP NO VENT (SUCTIONS) ×4 IMPLANT

## 2016-12-24 NOTE — Brief Op Note (Signed)
12/24/2016  10:46 AM  PATIENT:  Matthew Bush  31 y.o. male  PRE-OPERATIVE DIAGNOSIS:  DEVIATED SEPTUM, NASAL TURBINATE HYPERTROPHY  POST-OPERATIVE DIAGNOSIS:  DEVIATED SEPTUM, NASAL TURBINATE HYPERTROPHY  PROCEDURE:  Procedure(s): NASAL SEPTOPLASTY WITH BILATERAL INFERIOR TURBINATE REDUCTION (Bilateral) BILATERAL ENDOSCOPIC CONCHA BULLOSA RESECTION (Bilateral) LEFT MAXILLARY ANTROSTOMY WITH REMOVAL OF DISEASED  TISSUE WITH FUSION SCAN (Left) SINUS ENDO WITH FUSION (Bilateral)  SURGEON:  Surgeon(s) and Role:    Osborn Coho* Aengus Sauceda, MD - Primary  PHYSICIAN ASSISTANT:   ASSISTANTS: none   ANESTHESIA:   general  EBL:  Total I/O In: 1400 [I.V.:1400] Out: 100 [Blood:100]  BLOOD ADMINISTERED:none  DRAINS: none   LOCAL MEDICATIONS USED:  LIDOCAINE  and Amount: 7 ml  SPECIMEN:  Source of Specimen:  sinus contents  DISPOSITION OF SPECIMEN:  PATHOLOGY  COUNTS:  YES  TOURNIQUET:  * No tourniquets in log *  DICTATION: .Other Dictation: Dictation Number [id number]  PLAN OF CARE: Discharge to home after PACU  PATIENT DISPOSITION:  PACU - hemodynamically stable.   Delay start of Pharmacological VTE agent (>24hrs) due to surgical blood loss or risk of bleeding: not applicable

## 2016-12-24 NOTE — H&P (Signed)
Matthew Bush is an 31 y.o. male.   Chief Complaint: Nasal obstruction and sinusitis  HPI: hx of progressive nasal obstruction and recurrent sinusitis  Past Medical History:  Diagnosis Date  . GERD (gastroesophageal reflux disease)   . Hypertension    states under control with meds., has been on med. x 3 yr.  . Sinus headache   . Ventral hernia 11/2016    Past Surgical History:  Procedure Laterality Date  . INSERTION OF MESH N/A 12/09/2016   Procedure: INSERTION OF MESH;  Surgeon: Harriette Bouillon, MD;  Location: [facility];  Service: General;  Laterality: N/A;  . VASECTOMY    . VENTRAL HERNIA REPAIR N/A 12/09/2016   Procedure: VENTRAL HERNIA REPAIR WITH MESH;  Surgeon: Harriette Bouillon, MD;  Location: [facility];  Service: General;  Laterality: N/A;  . WISDOM TOOTH EXTRACTION      History reviewed. No pertinent family history. Social History:  reports that he has been smoking Cigars.  He has smoked for the past 2.00 years. He has never used smokeless tobacco. He reports that he drinks alcohol. He reports that he does not use drugs.  Allergies: No Known Allergies  Medications Prior to Admission  Medication Sig Dispense Refill  . acetaminophen (TYLENOL) 325 MG tablet Take 650 mg by mouth every 6 (six) hours as needed for pain.    Marland Kitchen amLODipine (NORVASC) 10 MG tablet Take 10 mg by mouth daily.    . clonazePAM (KLONOPIN) 1 MG tablet Take 1 mg by mouth 2 (two) times daily.    Marland Kitchen escitalopram (LEXAPRO) 10 MG tablet Take 10 mg by mouth daily.    Marland Kitchen ibuprofen (ADVIL,MOTRIN) 200 MG tablet Take 200 mg by mouth every 6 (six) hours as needed for pain.    Marland Kitchen ibuprofen (ADVIL,MOTRIN) 800 MG tablet Take 1 tablet (800 mg total) by mouth every 8 (eight) hours as needed. 30 tablet 0  . losartan-hydrochlorothiazide (HYZAAR) 100-25 MG tablet Take 1 tablet by mouth daily.    . ranitidine (ZANTAC) 150 MG tablet Take 150 mg by mouth at bedtime.      No results found for  this or any previous visit (from the past 48 hour(s)). No results found.  Review of Systems  Constitutional: Negative.   HENT: Positive for congestion.   Respiratory: Negative.   Cardiovascular: Negative.     Blood pressure 135/79, pulse (!) 56, temperature 98 F (36.7 C), temperature source Oral, resp. rate 20, height 5\' 7"  (1.702 m), weight 119 kg (262 lb 4 oz), SpO2 99 %. Physical Exam  Constitutional: He appears well-developed and well-nourished.  HENT:  Deviated septum  Neck: Normal range of motion. Neck supple.     Assessment/Plan Adm for OP sino-nasal surgery  Aja Whitehair, MD 12/24/2016, 8:29 AM

## 2016-12-24 NOTE — Op Note (Signed)
NAME:  Matthew Bush, Matthew Bush                 ACCOUNT NO.:  MEDICAL RECORD NO.:  [id number]  LOCATION:                                 FACILITY:  PHYSICIAN:  Kinnie Scales. Annalee Genta, M.D.DATE OF BIRTH:  10/14/1985  DATE OF PROCEDURE:  12/24/2016 DATE OF DISCHARGE:                              OPERATIVE REPORT   LOCATION:  [facility].  PREOPERATIVE DIAGNOSES: 1. Chronic sinusitis. 2. Deviated nasal septum with airway obstruction. 3. Bilateral inferior turbinate hypertrophy.  POSTOPERATIVE DIAGNOSES: 1. Chronic sinusitis. 2. Deviated nasal septum with airway obstruction. 3. Bilateral inferior turbinate hypertrophy.  INDICATION FOR SURGERY: 1. Chronic sinusitis. 2. Deviated nasal septum with airway obstruction. 3. Bilateral inferior turbinate hypertrophy.  PROCEDURE: 1. Bilateral endoscopic sinus surgery with intraoperative computer-     assisted navigation (Fusion) consisting of bilateral resection of     concha bullosa, bilateral anterior ethmoidectomy, bilateral nasal     frontal recess exploration, bilateral maxillary antrostomy with     removal of diseased tissue. 2. Nasal septoplasty. 3. Bilateral inferior turbinate reduction.  ANESTHESIA:  General endotracheal.  SURGEON:  Kinnie Scales. Annalee Genta, M.D.  COMPLICATIONS:  None.  ESTIMATED BLOOD LOSS:  Less than 150 mL.  DISPOSITION:  The patient transferred from the operating room to the recovery room in stable condition.  FINDINGS:  Large bilateral concha bullosa with obstruction of the middle meatus.  Moderate mucosal edema in the ostiomeatal complex with obstruction and a small amount of disease in the maxillary sinuses bilaterally without air-fluid level or evidence of infection.  No packing was placed at the conclusion of the procedure.  Bilateral Doyle nasal septal splints placed.  BRIEF HISTORY:  The patient is a 31 year old black male, who was referred to our office with a history of  progressive nasal airway obstruction and bilateral sinus headaches, pressure, and recurrent infection.  The patient has been appropriately treated with medical therapy including antibiotics, steroids, topical nasal steroids, and saline irrigation and despite treatment continued to have ongoing symptoms.  A CT scan was obtained, which showed a severely deviated septum with septal spurring, bilateral inferior turbinate hypertrophy, and large bilateral middle turbinate concha bullosa with lateralization of the uncinate process and obstruction of the ostiomeatal complex with mucosal disease.  Given the patient's history and findings and failure to respond to appropriate medical therapy, I recommended the above surgical procedures.  The risks and benefits were discussed in detail with the patient, who understood and agreed with our plan for surgery which is scheduled on elective basis at [facility].  DESCRIPTION OF PROCEDURE:  The patient was brought to the operating room on December 24, 2016, placed in supine position on the operating table. General endotracheal anesthesia was established without difficulty. When the patient was adequately anesthetized, he was positioned and prepped and draped.  Surgical time-out was then performed with correct identification of the patient and the surgical procedure.  His nose was then injected with a total of 7 mL of 1% lidocaine with 1:100,000 dilution epinephrine injected in submucosal fashion along the middle turbinate, lateral nasal wall, septum, and inferior turbinate.  The patient's nose was then packed with  Afrin-soaked cottonoid pledgets and left in place for approximately 10 minutes to allow for vasoconstriction and hemostasis.  The Xomed fusion headgear was applied and anatomic and surgical landmarks were identified and confirmed.  The navigation device was used throughout the sinus component of the surgical  procedure.  With the patient prepped, draped, and prepared for surgery, we started his endoscopic sinus procedure on the left-hand side using a 0-degree endoscope.  The nasal passageway was examined.  The patient had a large middle turbinate concha bullosa, which was resected using an endoscopic sickle knife to incise the anterior face of the concha.  The endoscopic scissors were then used to dissect carefully from anterior to posterior and the lateral component of the concha bullosa was resected in its entirety preserving the main aspect of the middle turbinate.  With the middle meatus cleared, the uncinate process was resected with Thru- cutting forceps and an anterior ethmoidectomy was performed dissecting the level of the ethmoid bulla and then superiorly to the level of the nasofrontal recess.  A 45 degree telescope with curved microdebrider and navigation was then used to dissect the superior aspect of the anterior ethmoid region, dissecting into the nasal frontal recess and clearing underlying mucosal inflammation and disease.  The patient had a widely patent nasofrontal recess at the conclusion of the procedure.  Attention was then turned to the lateral nasal wall and the natural ostium of the maxillary sinus was identified.  This was occluded by residual uncinate process and scar tissue, which was resected using Thru-Cutting forceps. The ostium was enlarged posteriorly and inferiorly.  The curved microdebrider was used within the maxillary sinus on the left-hand side to remove diseased tissue.  Nasal septoplasty was then performed.  A left anterior hemitransfixion incision was created.  A mucoperichondrial flap was elevated from anterior to posterior on the left-hand side.  The flaps were dissected in a subperichondrial fashion.  The anterior cartilage was crossed midline and mucoperichondrial flap elevated on the right.  Mid septal cartilage was then resected with a swivel  knife and this was removed, morselized, and later returned to the mucoperichondrial pocket. Dissection was then carried from anterior to posterior removing deviated bone and cartilage.  The patient had a large bony septal spur on the left, which was mobilized and resected preserving the overlying mucosa. With the septum brought to a good midline position, the morselized cartilage was returned to the mucoperichondrial pocket and the flaps were reapproximated with a 4-0 gut suture on a Keith needle in a horizontal mattress fashion.  At the conclusion of the surgical procedure, bilateral Doyle nasal septal splints were placed after the application of Bactroban ointment and sutured in position with a 3-0 Ethilon suture.  Attention was then turned to the right-hand side, and again using a 0 degree endoscope, the nasal passageway was examined.  The large concha bullosa on the right was identified and a vertical incision was made with a sickle knife.  Using endoscopic scissors, the concha bullosa was dissected from anterior to posterior and the lateral component was resected preserving the medial aspect of the middle turbinate.  The uncinate process was identified and resected from posterior to anterior and a curved microdebrider was then used to perform an anterior ethmoidectomy extending into the nasal frontal recess and frontal sinus on the right-hand side.  Attention was turned to the lateral nasal wall and the natural ostium of the sinus was identified.  This was obstructed with scar tissue and residual uncinate process,  which was resected.  The ostium was enlarged posteriorly and inferiorly and the microdebrider was used within the sinus to resect polypoid mucosa.  Attention was then turned to the inferior turbinates where bilateral inferior turbinate reduction was performed with bipolar intramural cautery set at 12 watts.  Two submucosal passes were made in each inferior turbinate.   When the turbinates were adequately cauterized, anterior incisions were created, overlying soft tissue elevated.  A small amount of turbinate bone was resected.  The turbinates were then outfractured creating more patent nasal cavity.  The patient's nasal cavity was then thoroughly inspected.  Surgical debris was cleared.  There was no active bleeding.  Sponge count was correct.  Nasopharynx was suctioned and orogastric tube was passed and the stomach contents were aspirated.  The patient was then awakened from his anesthetic.  He was extubated and transferred from the operating room to the recovery room in stable condition.  There were no complications.  Blood loss was less than 150 mL.          ______________________________ Kinnie Scales. Annalee Genta, M.D.     DLS/MEDQ  D:  16/01/9603  T:  12/24/2016  Job:  [id number]

## 2016-12-24 NOTE — Transfer of Care (Signed)
Immediate Anesthesia Transfer of Care Note  Patient: Matthew Bush  Procedure(s) Performed: Procedure(s): NASAL SEPTOPLASTY WITH BILATERAL INFERIOR TURBINATE REDUCTION (Bilateral) BILATERAL ENDOSCOPIC CONCHA BULLOSA RESECTION (Bilateral) LEFT MAXILLARY ANTROSTOMY WITH REMOVAL OF DISEASED  TISSUE WITH FUSION SCAN (Left) SINUS ENDO WITH FUSION (Bilateral)  Patient Location: PACU  Anesthesia Type:General  Level of Consciousness: awake  Airway & Oxygen Therapy: Patient Spontanous Breathing and Patient connected to face mask oxygen  Post-op Assessment: Report given to RN and Post -op Vital signs reviewed and stable  Post vital signs: Reviewed and stable  Last Vitals:  Vitals:   12/24/16 0828 12/24/16 1045  BP: 135/79 130/77  Pulse: (!) 56 97  Resp:  15  Temp:    SpO2: 99% 100%    Last Pain:  Vitals:   12/24/16 0828  TempSrc:   PainSc: 3       Patients Stated Pain Goal: 4 (12/24/16 0737)  Complications: No apparent anesthesia complications

## 2016-12-24 NOTE — Anesthesia Procedure Notes (Signed)
Procedure Name: Intubation Date/Time: 12/24/2016 8:48 AM Performed by: Caren Macadam Pre-anesthesia Checklist: Patient identified, Emergency Drugs available, Suction available and Patient being monitored Patient Re-evaluated:Patient Re-evaluated prior to induction Oxygen Delivery Method: Circle system utilized Preoxygenation: Pre-oxygenation with 100% oxygen Induction Type: IV induction Ventilation: Mask ventilation without difficulty Laryngoscope Size: Miller and 2 Grade View: Grade I Tube type: Oral Tube size: 7.0 mm Number of attempts: 1 Airway Equipment and Method: Stylet and Oral airway Placement Confirmation: ETT inserted through vocal cords under direct vision,  positive ETCO2 and breath sounds checked- equal and bilateral Secured at: 24 cm Tube secured with: Tape Dental Injury: Teeth and Oropharynx as per pre-operative assessment

## 2016-12-24 NOTE — Discharge Instructions (Signed)

## 2016-12-24 NOTE — Anesthesia Postprocedure Evaluation (Signed)
Anesthesia Post Note  Patient: Matthew Bush  Procedure(s) Performed: Procedure(s) (LRB): NASAL SEPTOPLASTY WITH BILATERAL INFERIOR TURBINATE REDUCTION (Bilateral) BILATERAL ENDOSCOPIC CONCHA BULLOSA RESECTION (Bilateral) LEFT MAXILLARY ANTROSTOMY WITH REMOVAL OF DISEASED  TISSUE WITH FUSION SCAN (Left) SINUS ENDO WITH FUSION (Bilateral)     Patient location during evaluation: PACU Anesthesia Type: General Level of consciousness: awake and alert Pain management: pain level controlled Vital Signs Assessment: post-procedure vital signs reviewed and stable Respiratory status: spontaneous breathing, nonlabored ventilation, respiratory function stable and patient connected to nasal cannula oxygen Cardiovascular status: blood pressure returned to baseline and stable Postop Assessment: no signs of nausea or vomiting Anesthetic complications: no    Last Vitals:  Vitals:   12/24/16 1115 12/24/16 1130  BP: 116/73 112/68  Pulse: (!) 58 70  Resp: 18 13  Temp:    SpO2: 99% 99%    Last Pain:  Vitals:   12/24/16 1111  TempSrc:   PainSc: 8                  Quenesha Douglass

## 2016-12-25 ENCOUNTER — Encounter (HOSPITAL_BASED_OUTPATIENT_CLINIC_OR_DEPARTMENT_OTHER): Payer: Self-pay | Admitting: Otolaryngology

## 2017-01-05 DIAGNOSIS — G4700 Insomnia, unspecified: Secondary | ICD-10-CM | POA: Diagnosis not present

## 2017-01-05 DIAGNOSIS — I10 Essential (primary) hypertension: Secondary | ICD-10-CM | POA: Diagnosis not present

## 2017-01-05 DIAGNOSIS — Z23 Encounter for immunization: Secondary | ICD-10-CM | POA: Diagnosis not present

## 2017-01-28 ENCOUNTER — Emergency Department (HOSPITAL_COMMUNITY)
Admission: EM | Admit: 2017-01-28 | Discharge: 2017-01-28 | Disposition: A | Discharge status: Home / Self Care | Payer: 59 | Attending: Emergency Medicine | Admitting: Emergency Medicine

## 2017-01-28 DIAGNOSIS — R0602 Shortness of breath: Secondary | ICD-10-CM | POA: Diagnosis not present

## 2017-01-28 DIAGNOSIS — Z5321 Procedure and treatment not carried out due to patient leaving prior to being seen by health care provider: Secondary | ICD-10-CM | POA: Insufficient documentation

## 2017-02-05 DIAGNOSIS — R002 Palpitations: Secondary | ICD-10-CM | POA: Diagnosis not present

## 2017-02-05 DIAGNOSIS — R0789 Other chest pain: Secondary | ICD-10-CM | POA: Diagnosis not present

## 2017-02-16 DIAGNOSIS — R002 Palpitations: Secondary | ICD-10-CM | POA: Diagnosis not present

## 2017-02-16 DIAGNOSIS — R0789 Other chest pain: Secondary | ICD-10-CM | POA: Diagnosis not present

## 2017-02-16 DIAGNOSIS — I10 Essential (primary) hypertension: Secondary | ICD-10-CM | POA: Diagnosis not present

## 2017-02-24 DIAGNOSIS — R079 Chest pain, unspecified: Secondary | ICD-10-CM | POA: Diagnosis not present

## 2017-03-23 ENCOUNTER — Ambulatory Visit (INDEPENDENT_AMBULATORY_CARE_PROVIDER_SITE_OTHER): Payer: 59 | Admitting: Family Medicine

## 2017-03-23 ENCOUNTER — Encounter: Payer: Self-pay | Admitting: Family Medicine

## 2017-03-23 DIAGNOSIS — I10 Essential (primary) hypertension: Secondary | ICD-10-CM | POA: Diagnosis not present

## 2017-03-23 NOTE — Patient Instructions (Addendum)
-   Consistency in eating and sleeping is very helpful to weight management.   - Sleep deprivation makes it harder to manage weight effectively.    - If you have trouble sleeping, the LAST thing you want to do is expose yourself to an electronic screen.    - Explore options for apps for relaxation meditations and/or yoga meditation.  For example, look for Yoga Nidra.    - Read the sleep hygiene handout provided today.    - Keep in mind that eating with regularity does itself increase metabolic rate.  There is a higher metabolic (caloric) cost to WHOLE, REAL foods over highly refined foods.  In other words, it is to your advantage to limit fast foods and convenience foods.   - Track your headaches: Write down when they occur, how severe, and what you ate prior to the headache (sources of sugar/caffeine/other?).    Goals:  1. Eat at least 3 REAL meals and 1-2 snacks per day.  Aim for no more than 5 hours between eating.  Eat breakfast within one hour of getting up.   - A REAL meal includes at least some protein, some starch, and vegetables and/or fruit.    - Many people find that once they start getting real meals with consistency, they have fewer cravings for sweets.  This may be because eating balanced meals with regularity helps to stabilize your blood sugar.   2. Obtain twice the volume of vegetables as either protein or starchy foods for both lunch and dinner.  PLANNING AHEAD WILL BE KEY.   Complete your GOALS SHEET, and bring to follow-up January 7.

## 2017-03-23 NOTE — Progress Notes (Addendum)
Medical Nutrition Therapy:  Appt start time: 1100 end time:  1200. PCP Deatra JamesVyvyan Sun, MD (referred by Tally Joeavid Swayne, MD) ADDENDUM: I spoke with Dr. Chase CallerSun's assistant on 03-26-17.  Dr. Wynelle LinkSun does not want to prescribe phentermine b/c of Mr. Sharol HarnessSimmons' HTN.  She is willing to prescribe 1-2 mon of Belviq IF he is consistent with MNT follow-up appts.  I called Mr. Sharol HarnessSimmons to relate this information, and he will check to see about insurance coverage of the medication, then will schedule an appt with Dr. Wynelle LinkSun if it is a covered medication.    Assessment:  Primary concerns today: Weight management.  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  He ate only fruit and veg's, and drank only water, which was not sustainable.  He was motivated to lower his BP at the time.    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  Janyth Pupaicholas lives with his wife and two kids (10-YO son, 14-YO daughter).  He does most of the cooking, but admits recent meals have included a lot of fast foods.    Janyth Pupaicholas is interested in trying a medication that will help with weight loss, having read about many individuals who have been especially helped in jump-starting their weight loss this way.    Learning Readiness: Ready  Usual eating pattern includes 2 meals and 3 snacks per day. Frequent foods and beverages include water, chx, some form of starch, veg's.  Avoided foods include olives.   Usual physical activity includes sporadic exercise, such as jump rope intervals for 30 min (1-3 X wk).   Usual sleep is 7-8 hrs on non-work days; work days very variable.  Feels he does not get an optimal amt of sleep.  Naps when he gets the opportunity.    24-hr recall: (Up at 7 AM; worked a 24-hr shift starting at 8 AM) B (9 AM)-   3-4 slc bacon, 2 eggs, 1/2 slc biscuit, 1 tsp grape jelly, 1/2 c coffee, cream &  sugar Snk ( AM)-   water L (1 PM)-  1 c chx casserole, water Snk ( PM)-  --- D (6 PM)-  3 slc think crust meat pizza, water Snk ( PM)-  --- Typical day? No.  On Sundays, have breakfast at the station.  Breakfast is otherwise nothing or left-overs or fruit.    Progress Towards Goal(s):  In progress.   Nutritional Diagnosis:  NB-2.1 Physical inactivity As related to time constraints.  As evidenced by no regular exercise.    Intervention:  Nutrition education  Handouts given during visit include:  AVS  Sleep handout  Goals Sheet  Demonstrated degree of understanding via:  Teach Back  Barriers to learning/adherence to lifestyle change: Time constraints related to a lot of working hours, including 24-hr shift.   Monitoring/Evaluation:  Dietary intake, exercise, and body weight in 6 week(s).

## 2017-05-04 ENCOUNTER — Encounter: Payer: Self-pay | Admitting: Psychology

## 2017-05-04 ENCOUNTER — Ambulatory Visit (INDEPENDENT_AMBULATORY_CARE_PROVIDER_SITE_OTHER): Payer: 59 | Admitting: Family Medicine

## 2017-05-04 ENCOUNTER — Encounter: Payer: Self-pay | Admitting: Family Medicine

## 2017-05-04 DIAGNOSIS — Z6841 Body Mass Index (BMI) 40.0 and over, adult: Secondary | ICD-10-CM | POA: Diagnosis not present

## 2017-05-04 NOTE — Patient Instructions (Addendum)
-   Consider class on Managing Non-hunger eating that starts Feb 4 (see schedule provided).   - Make a list of 7-10 dinner meals that taste good, are relatively quick and easy to prepare, and that meet your nutritional needs.  Use this as a basis for shopping, so you can make one of these meals any time.  Bring your list to your follow-up appointment for review.    - Make a list of foods you can use for breakfast, even if it is grabbed as you're running out the door.    Goals remain (almost) the same:  1. Eat at least 3 REAL meals and 1-2 snacks per day.  Aim for no more than 5 hours between eating.  Eat breakfast within one hour of getting up.              - A REAL meal includes at least some protein, some starch, and vegetables and/or fruit.               - Many people find that once they start getting real meals with consistency, they have fewer cravings for sweets.  This may be because eating balanced meals with regularity helps to stabilize your blood sugar.   2. Obtain twice the volume of vegetables as either protein or starchy foods for both lunch and dinner.  PLANNING AHEAD WILL BE KEY.  3. Some form of physical activity at least 30 min 3 X wk.    Complete your GOALS SHEET, and bring to follow-up on Feb 4 at 4 PM.

## 2017-05-04 NOTE — Progress Notes (Signed)
Medical Nutrition Therapy:  Appt start time: 1100 end time:  1200. PCP Matthew JamesVyvyan Sun, MD. Matthew Bush at [facility]  Assessment:  Primary concerns today: Weight management.  Matthew Bush feels he has not made as many changes as he could/should have, although he has been under a tremendous amount of stress.  He agreed to see Matthew Bush with our [facility] at the end of today's nutrition appt.  Matthew Bush has seen a counselor in the past, a difficult decision for him, but was pretty upset when the counselor did not even get his name right at follow-up (calling him Mr. Eulah PontMurphy).    He identifies himself as an Surveyor, quantityemotional eater, and is especially drawn to sweets when anxious.  Has been trying to eat more meat, and limit carb's, although this was especially difficult over the holidays.  Matthew Bush feels sweets intake is a significant contributor to his weight.    Matthew Bush started [employer] ResourcesBelviq 5 days ago; can't yet tell if it is suppressing appetite.    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  Matthew Bush lives with his wife and two kids (10-YO son, who has Crohn's disease, & 14-YO daughter).  He does most of the cooking, although recent meals are still often fast foods.  Kids are both picky eaters.    Learning Readiness: Ready  Usual eating pattern includes 2-3 meals and 2-3 snacks per day.  Recent physical activity 0-1 workout per week.  Still working 2 jobs, so hard to find the time.   includes sporadic exercise, such as jump rope intervals for 30 min (1-3 X wk).   Sleep has been worse recently, related to worries.  He has been able to fall asleep, but is waking and can't fall back to sleep.    24-hr recall:  (Up at 7:30 AM; to bed after midnight) B (8:45 AM)-  1 scrmbld egg, 1/2 c grits, 2 strips bacon, water, 2 oz o.j. Snk ( AM)-  --- L (1 PM)-  1/2 Little Debbie Swiss Cake, 5 oz Coke Snk ( PM)-  water D (6 PM)-  7 oz steak, 6 boiled shrimp, broc with  parmesan, 1 cupcake, 12 oz Coke Snk ( PM)-  Water Typical day? No. except that breakfast was smaller than usual b/c of not feeling well.    Progress Towards Goal(s):  In progress.  Nutritional Diagnosis:  NB-2.1 Physical inactivity As related to time constraints.  As evidenced by no regular exercise.    Intervention:  Nutrition education  Handouts given during visit include:  After-Visit Summary (AVS)  "Home Assembled Fast Food" handout  Schedule of Managing Non-hunger Eating classes  Goals Sheet, revised  Demonstrated degree of understanding via:  Teach Back  Barriers to learning/adherence to lifestyle change: Time constraints related to a lot of working hours, including 24-hr shift; current stress level also significant factor.    Monitoring/Evaluation:  Dietary intake, exercise, and body weight in 4 week(s).

## 2017-05-04 NOTE — Progress Notes (Signed)
Dr. Gerilyn Pilgrim requested a Behavioral Health Consult.  Presenting Issue:  The patient describes stress related to working two jobs (Dr. Gerilyn Pilgrim shared that the patient is having marital difficulties and has declared bankruptcy).  Report of symptoms:  The patient's anxiety is causing problems with his sleep and his diet; he has difficulty winding down at night and eats when distressed, causing him to gain weight.  Duration of CURRENT symptoms:  C. 2 years.  Age of onset of first mood disturbance:  1 years ago  Impact on function:  Poor sleep, comfort eating  Psychiatric History - Diagnoses: no-- just "stress" - Hospitalizations: no MI - outpatient surgery for herina - Pharmacotherapy: zoloft and klonopin from PCP - Outpatient therapy: tried once, did not like  Family history of psychiatric issues:  No, but extensive family history of high blood pressure  Current and history of substance use:  no  Medical conditions that might explain or contribute to symptoms:  High blood pressure  PHQ-9:  18 (moderately severe, passive SI, somewhat difficult) PHQ-9 today is 18  Anhedonia:   1 Mood:  2 Sleep:  3 Energy: 3 Appetite: 3 Worthless: 1 Concentrate: 3 Psychomotor: 1 SI:  1  GAD-7:  18 (severe, somewhat difficult  MDQ (if indicated):  Not given.  Because the patient endorsed passive suicidal ideation, I conducted a brief suicide assessment. Suicide Assessment  Plan: - How specific is the plan: no plan-- just thoughts that would be "better off dead" - How lethal are the means: see above - no plan - Does the patient have access to the means:see above - no plan  - Does the patient have social support: his wife is supportive despite their marital difficulties.  Protective factors (what has kept the patient from self-harm thus far):  Only passive SI, no plan.  Substance use / abuse:  no  Presence of hallucinations / delusions: not asked; should follow up next week.  History of SI  / Attempts: not asked, follow up next week.  Family history of attempted or completed suicide: not asked, follow up next week.  Duration and Intensity of SI:  Last two months, passive, no plan.  History of prior psychiatric hospitalizations: none.  Chart review for additional risk factors (cite chronic pain, insomnia, panic attacks, age, gender, if present): obesity, high blood pressure, symptoms of severe depression and severe anxiety  Coping mechanisms: Will talk to his wife, ride his motorcyle, take care of his fish or eat candy (last resort, as he is trying to lose weight) if he is in intense distress.  How likely are you to act on these thoughts of hurting yourself or ending your life sometime over the next month? NOT LIKELY AT ALL  Based upon this assessment, it does not appear that further action is necessary at this time, but patient will be re-assessed next week.  Assessment / Plan / Recommendations: Matthew Bush's anxiety and depression are both severe and warrant treatment. He does not think Zoloft is helping him and he has increased his Klonopin usage from 1-2 times per week to 3-4 times per week. I suggested that he speak with his PCP at [facility] about changing his medication to either increase his Zoloft dosage or try another SSRI. We practiced diaphragmatic breathing and I demonstrated a meditation exercise ("leaves on a stream") to help him get ready for sleep. He set a goal to quit using all screens by 10 pm each night. He typically plays with his phone and watches TV  for several hours until he falls asleep after midnight and wakes up around 6 a.m. He rated this goal and the need for better sleep as 9/10 for importance. He rated his confidence as 5/10, explaining that he has tried to cut out screen time in the past. We initially set a goal of 9 pm to cut off screens, and he revised it to 10 pm to make this goal more realistic. Matthew Bush will return to see me on January 14 at 4:00  p.m.  Warmhandoff complete.

## 2017-05-11 ENCOUNTER — Ambulatory Visit: Payer: 59

## 2017-05-20 DIAGNOSIS — J310 Chronic rhinitis: Secondary | ICD-10-CM | POA: Diagnosis not present

## 2017-05-20 DIAGNOSIS — J0141 Acute recurrent pansinusitis: Secondary | ICD-10-CM | POA: Diagnosis not present

## 2017-06-01 ENCOUNTER — Ambulatory Visit (INDEPENDENT_AMBULATORY_CARE_PROVIDER_SITE_OTHER): Payer: 59 | Admitting: Family Medicine

## 2017-06-01 ENCOUNTER — Encounter: Payer: Self-pay | Admitting: Family Medicine

## 2017-06-01 DIAGNOSIS — E669 Obesity, unspecified: Secondary | ICD-10-CM

## 2017-06-01 NOTE — Patient Instructions (Addendum)
-   You want to stay ahead of hunger.  The last thing you want to have happen is to get over-hungry.    - The 3 Qs of a good food decision:   - How hungry am I?  - What am I in the mood for?  - What's good for me? - Ask ALL THREE questions, not just one or two.  You need a food choice that is satisfying.    - Appetite is best controlled when we eat with regularity (and when we get satisfaction from the foods we eat).    - Preventive eating:   Most people need to eat something about every 4-5 hours while awake.  Normally, it is a good idea to eat only when you're hungry, but if you aren't especially hungry, but you know it will be a long time till you'll have a chance to eat, you will be well served to eat something now.    - Make a list of 7-10 dinner meals that taste good, are relatively quick and easy to prepare, and that meet your nutritional needs.  Use this as a basis for shopping, so you can make one of these meals any time.  Bring your list to your follow-up appointment for review.    Food Goals: 1. Set aside a few minutes once a week to plan foods for the upcoming week.   2. Eat at least 3 REAL meals and 1-2 snacks per day at least days per week.  Aim for no more than 5 hours between eating.  Eat breakfast within one hour of getting up.  A REAL meal includes at least some protein, some starch, and vegetables and/or fruit.
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
## 2017-07-06 ENCOUNTER — Ambulatory Visit: Payer: 59 | Admitting: Family Medicine

## 2017-07-07 DIAGNOSIS — E785 Hyperlipidemia, unspecified: Secondary | ICD-10-CM | POA: Diagnosis not present

## 2017-07-07 DIAGNOSIS — I10 Essential (primary) hypertension: Secondary | ICD-10-CM | POA: Diagnosis not present

## 2017-07-07 DIAGNOSIS — G4700 Insomnia, unspecified: Secondary | ICD-10-CM | POA: Diagnosis not present

## 2017-07-15 DIAGNOSIS — J029 Acute pharyngitis, unspecified: Secondary | ICD-10-CM | POA: Diagnosis not present

## 2017-09-30 DIAGNOSIS — S0501XA Injury of conjunctiva and corneal abrasion without foreign body, right eye, initial encounter: Secondary | ICD-10-CM | POA: Diagnosis not present

## 2017-10-02 DIAGNOSIS — S0501XD Injury of conjunctiva and corneal abrasion without foreign body, right eye, subsequent encounter: Secondary | ICD-10-CM | POA: Diagnosis not present

## 2017-10-09 DIAGNOSIS — S0501XD Injury of conjunctiva and corneal abrasion without foreign body, right eye, subsequent encounter: Secondary | ICD-10-CM | POA: Diagnosis not present

## 2017-11-22 ENCOUNTER — Observation Stay (HOSPITAL_COMMUNITY)
Admission: EM | Admit: 2017-11-22 | Discharge: 2017-11-24 | Disposition: A | Discharge status: Home / Self Care | Payer: 59 | Attending: Student | Admitting: Student

## 2017-11-22 ENCOUNTER — Emergency Department (HOSPITAL_COMMUNITY): Payer: 59

## 2017-11-22 ENCOUNTER — Encounter (HOSPITAL_COMMUNITY): Payer: Self-pay | Admitting: Emergency Medicine

## 2017-11-22 ENCOUNTER — Observation Stay (HOSPITAL_COMMUNITY): Payer: 59

## 2017-11-22 DIAGNOSIS — R4020 Unspecified coma: Secondary | ICD-10-CM | POA: Diagnosis not present

## 2017-11-22 DIAGNOSIS — Z23 Encounter for immunization: Secondary | ICD-10-CM | POA: Insufficient documentation

## 2017-11-22 DIAGNOSIS — S63075A Dislocation of distal end of left ulna, initial encounter: Secondary | ICD-10-CM | POA: Diagnosis present

## 2017-11-22 DIAGNOSIS — S52502A Unspecified fracture of the lower end of left radius, initial encounter for closed fracture: Secondary | ICD-10-CM | POA: Diagnosis not present

## 2017-11-22 DIAGNOSIS — S52572A Other intraarticular fracture of lower end of left radius, initial encounter for closed fracture: Principal | ICD-10-CM | POA: Insufficient documentation

## 2017-11-22 DIAGNOSIS — S52692A Other fracture of lower end of left ulna, initial encounter for closed fracture: Secondary | ICD-10-CM | POA: Insufficient documentation

## 2017-11-22 DIAGNOSIS — S52592A Other fractures of lower end of left radius, initial encounter for closed fracture: Secondary | ICD-10-CM | POA: Diagnosis not present

## 2017-11-22 DIAGNOSIS — S62102A Fracture of unspecified carpal bone, left wrist, initial encounter for closed fracture: Secondary | ICD-10-CM

## 2017-11-22 DIAGNOSIS — S3991XA Unspecified injury of abdomen, initial encounter: Secondary | ICD-10-CM | POA: Diagnosis not present

## 2017-11-22 DIAGNOSIS — Z419 Encounter for procedure for purposes other than remedying health state, unspecified: Secondary | ICD-10-CM

## 2017-11-22 DIAGNOSIS — J342 Deviated nasal septum: Secondary | ICD-10-CM | POA: Insufficient documentation

## 2017-11-22 DIAGNOSIS — S52602A Unspecified fracture of lower end of left ulna, initial encounter for closed fracture: Secondary | ICD-10-CM

## 2017-11-22 DIAGNOSIS — Z7951 Long term (current) use of inhaled steroids: Secondary | ICD-10-CM | POA: Insufficient documentation

## 2017-11-22 DIAGNOSIS — K219 Gastro-esophageal reflux disease without esophagitis: Secondary | ICD-10-CM | POA: Insufficient documentation

## 2017-11-22 DIAGNOSIS — R52 Pain, unspecified: Secondary | ICD-10-CM | POA: Diagnosis not present

## 2017-11-22 DIAGNOSIS — I10 Essential (primary) hypertension: Secondary | ICD-10-CM | POA: Diagnosis not present

## 2017-11-22 DIAGNOSIS — Z79899 Other long term (current) drug therapy: Secondary | ICD-10-CM | POA: Diagnosis not present

## 2017-11-22 DIAGNOSIS — S6992XA Unspecified injury of left wrist, hand and finger(s), initial encounter: Secondary | ICD-10-CM | POA: Diagnosis not present

## 2017-11-22 DIAGNOSIS — S63005A Unspecified dislocation of left wrist and hand, initial encounter: Secondary | ICD-10-CM

## 2017-11-22 DIAGNOSIS — S0990XA Unspecified injury of head, initial encounter: Secondary | ICD-10-CM | POA: Diagnosis not present

## 2017-11-22 DIAGNOSIS — S299XXA Unspecified injury of thorax, initial encounter: Secondary | ICD-10-CM | POA: Diagnosis not present

## 2017-11-22 DIAGNOSIS — S199XXA Unspecified injury of neck, initial encounter: Secondary | ICD-10-CM | POA: Diagnosis not present

## 2017-11-22 LAB — URINALYSIS, ROUTINE W REFLEX MICROSCOPIC
Bilirubin Urine: NEGATIVE
GLUCOSE, UA: NEGATIVE mg/dL
HGB URINE DIPSTICK: NEGATIVE
Ketones, ur: NEGATIVE mg/dL
LEUKOCYTES UA: NEGATIVE
Nitrite: NEGATIVE
Protein, ur: NEGATIVE mg/dL
SPECIFIC GRAVITY, URINE: 1.019 (ref 1.005–1.030)
pH: 6 (ref 5.0–8.0)

## 2017-11-22 LAB — I-STAT CHEM 8, ED
BUN: 15 mg/dL (ref 6–20)
CALCIUM ION: 1.19 mmol/L (ref 1.15–1.40)
Chloride: 108 mmol/L (ref 98–111)
Creatinine, Ser: 1.2 mg/dL (ref 0.61–1.24)
Glucose, Bld: 109 mg/dL — ABNORMAL HIGH (ref 70–99)
HCT: 43 % (ref 39.0–52.0)
HEMOGLOBIN: 14.6 g/dL (ref 13.0–17.0)
Potassium: 3.7 mmol/L (ref 3.5–5.1)
SODIUM: 143 mmol/L (ref 135–145)
TCO2: 22 mmol/L (ref 22–32)

## 2017-11-22 LAB — CBC
HEMATOCRIT: 42.7 % (ref 39.0–52.0)
HEMOGLOBIN: 14 g/dL (ref 13.0–17.0)
MCH: 27.6 pg (ref 26.0–34.0)
MCHC: 32.8 g/dL (ref 30.0–36.0)
MCV: 84.2 fL (ref 78.0–100.0)
Platelets: 288 10*3/uL (ref 150–400)
RBC: 5.07 MIL/uL (ref 4.22–5.81)
RDW: 13.8 % (ref 11.5–15.5)
WBC: 7.2 10*3/uL (ref 4.0–10.5)

## 2017-11-22 LAB — I-STAT CG4 LACTIC ACID, ED: LACTIC ACID, VENOUS: 1.29 mmol/L (ref 0.5–1.9)

## 2017-11-22 LAB — COMPREHENSIVE METABOLIC PANEL
ALBUMIN: 4.1 g/dL (ref 3.5–5.0)
ALT: 26 U/L (ref 0–44)
ANION GAP: 9 (ref 5–15)
AST: 29 U/L (ref 15–41)
Alkaline Phosphatase: 64 U/L (ref 38–126)
BUN: 13 mg/dL (ref 6–20)
CO2: 23 mmol/L (ref 22–32)
Calcium: 9.3 mg/dL (ref 8.9–10.3)
Chloride: 109 mmol/L (ref 98–111)
Creatinine, Ser: 1.18 mg/dL (ref 0.61–1.24)
GFR calc Af Amer: >60 mL/min (ref >60)
GFR calc non Af Amer: >60 mL/min (ref >60)
GLUCOSE: 111 mg/dL — AB (ref 70–99)
POTASSIUM: 3.7 mmol/L (ref 3.5–5.1)
SODIUM: 141 mmol/L (ref 135–145)
Total Bilirubin: 0.9 mg/dL (ref 0.3–1.2)
Total Protein: 7.3 g/dL (ref 6.5–8.1)

## 2017-11-22 LAB — SAMPLE TO BLOOD BANK

## 2017-11-22 LAB — ETHANOL: Alcohol, Ethyl (B): <10 mg/dL (ref <10)

## 2017-11-22 LAB — PROTIME-INR
INR: 0.96
Prothrombin Time: 12.7 seconds (ref 11.4–15.2)

## 2017-11-22 LAB — CDS SEROLOGY

## 2017-11-22 MED ORDER — PROPOFOL 10 MG/ML IV BOLUS
0.5000 mg/kg | Freq: Once | INTRAVENOUS | Status: AC
  Administered 2017-11-22: 100 mg via INTRAVENOUS
  Filled 2017-11-22: qty 20

## 2017-11-22 MED ORDER — FENTANYL CITRATE (PF) 100 MCG/2ML IJ SOLN
50.0000 ug | Freq: Once | INTRAMUSCULAR | Status: AC
  Administered 2017-11-22: 50 ug via INTRAVENOUS

## 2017-11-22 MED ORDER — IOHEXOL 300 MG/ML  SOLN
100.0000 mL | Freq: Once | INTRAMUSCULAR | Status: AC | PRN
  Administered 2017-11-22: 100 mL via INTRAVENOUS

## 2017-11-22 MED ORDER — TETANUS-DIPHTH-ACELL PERTUSSIS 5-2.5-18.5 LF-MCG/0.5 IM SUSP
0.5000 mL | Freq: Once | INTRAMUSCULAR | Status: AC
  Administered 2017-11-22: 0.5 mL via INTRAMUSCULAR

## 2017-11-22 MED ORDER — FENTANYL CITRATE (PF) 100 MCG/2ML IJ SOLN
INTRAMUSCULAR | Status: AC
  Administered 2017-11-22: 50 ug
  Filled 2017-11-22: qty 2

## 2017-11-22 MED ORDER — PROPOFOL 10 MG/ML IV BOLUS
INTRAVENOUS | Status: AC
  Filled 2017-11-22: qty 20

## 2017-11-22 MED ORDER — PROPOFOL 10 MG/ML IV BOLUS
INTRAVENOUS | Status: AC | PRN
  Administered 2017-11-22 (×2): 50 mg via INTRAVENOUS

## 2017-11-22 NOTE — Progress Notes (Signed)
RRT at bedside for conscious sedation for left wrist reduction. Pt tolerated procedure well. No complications noted. SATs stable throughout.
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
## 2017-11-22 NOTE — ED Notes (Signed)
EDP at bedside  

## 2017-11-22 NOTE — Progress Notes (Signed)
Orthopedic Tech Progress Note Patient Details:  Matthew Bush 08/30/1985 [id number]  Ortho Devices Type of Ortho Device: Sugartong splint, Arm sling Ortho Device/Splint Location: lue. Plaster splint. assisted dr with splint application. Ortho Device/Splint Interventions: Ordered, Application, Adjustment   Post Interventions Patient Tolerated: Well Instructions Provided: Care of device, Adjustment of device   [name], [name] 11/22/2017, 11:42 PM
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
## 2017-11-23 ENCOUNTER — Observation Stay (HOSPITAL_COMMUNITY): Payer: 59

## 2017-11-23 ENCOUNTER — Other Ambulatory Visit: Payer: Self-pay

## 2017-11-23 ENCOUNTER — Observation Stay (HOSPITAL_COMMUNITY): Payer: 59 | Admitting: Anesthesiology

## 2017-11-23 ENCOUNTER — Encounter (HOSPITAL_COMMUNITY): Payer: Self-pay | Admitting: Anesthesiology

## 2017-11-23 ENCOUNTER — Encounter (HOSPITAL_COMMUNITY)
Admission: EM | Disposition: A | Discharge status: Home / Self Care | Payer: Self-pay | Source: Home / Self Care | Attending: Emergency Medicine

## 2017-11-23 DIAGNOSIS — S62102D Fracture of unspecified carpal bone, left wrist, subsequent encounter for fracture with routine healing: Secondary | ICD-10-CM | POA: Diagnosis not present

## 2017-11-23 DIAGNOSIS — S52572A Other intraarticular fracture of lower end of left radius, initial encounter for closed fracture: Secondary | ICD-10-CM | POA: Diagnosis not present

## 2017-11-23 DIAGNOSIS — G8918 Other acute postprocedural pain: Secondary | ICD-10-CM | POA: Diagnosis not present

## 2017-11-23 DIAGNOSIS — S52002A Unspecified fracture of upper end of left ulna, initial encounter for closed fracture: Secondary | ICD-10-CM | POA: Diagnosis not present

## 2017-11-23 DIAGNOSIS — S52692A Other fracture of lower end of left ulna, initial encounter for closed fracture: Secondary | ICD-10-CM | POA: Diagnosis not present

## 2017-11-23 DIAGNOSIS — S52602A Unspecified fracture of lower end of left ulna, initial encounter for closed fracture: Secondary | ICD-10-CM | POA: Diagnosis not present

## 2017-11-23 DIAGNOSIS — Z23 Encounter for immunization: Secondary | ICD-10-CM | POA: Diagnosis not present

## 2017-11-23 DIAGNOSIS — S52502A Unspecified fracture of the lower end of left radius, initial encounter for closed fracture: Secondary | ICD-10-CM | POA: Diagnosis not present

## 2017-11-23 DIAGNOSIS — I10 Essential (primary) hypertension: Secondary | ICD-10-CM | POA: Diagnosis not present

## 2017-11-23 DIAGNOSIS — S52592A Other fractures of lower end of left radius, initial encounter for closed fracture: Secondary | ICD-10-CM | POA: Diagnosis not present

## 2017-11-23 HISTORY — PX: OPEN REDUCTION INTERNAL FIXATION (ORIF) DISTAL RADIAL FRACTURE: SHX5989

## 2017-11-23 LAB — HIV ANTIBODY (ROUTINE TESTING W REFLEX): HIV SCREEN 4TH GENERATION: NONREACTIVE

## 2017-11-23 LAB — MRSA PCR SCREENING: MRSA BY PCR: NEGATIVE

## 2017-11-23 SURGERY — OPEN REDUCTION INTERNAL FIXATION (ORIF) DISTAL RADIUS FRACTURE
Anesthesia: General | Site: Arm Lower | Laterality: Left

## 2017-11-23 MED ORDER — BISACODYL 10 MG RE SUPP: 10.0000 mg | Freq: Every day | RECTAL | Status: DC | PRN

## 2017-11-23 MED ORDER — CHLORHEXIDINE GLUCONATE 4 % EX LIQD: 60.0000 mL | Freq: Once | CUTANEOUS | Status: DC

## 2017-11-23 MED ORDER — DEXMEDETOMIDINE HCL 200 MCG/2ML IV SOLN
INTRAVENOUS | Status: DC | PRN
  Administered 2017-11-23: 4 ug via INTRAVENOUS
  Administered 2017-11-23: 8 ug via INTRAVENOUS
  Administered 2017-11-23 (×2): 4 ug via INTRAVENOUS

## 2017-11-23 MED ORDER — MAGNESIUM CITRATE PO SOLN: 1.0000 | Freq: Once | ORAL | Status: DC | PRN

## 2017-11-23 MED ORDER — METHOCARBAMOL 750 MG PO TABS: 750.0000 mg | ORAL_TABLET | Freq: Four times a day (QID) | ORAL | 0 refills | Status: AC | PRN

## 2017-11-23 MED ORDER — LACTATED RINGERS IV SOLN
INTRAVENOUS | Status: DC | PRN
  Administered 2017-11-23 (×2): via INTRAVENOUS

## 2017-11-23 MED ORDER — BUPIVACAINE-EPINEPHRINE (PF) 0.5% -1:200000 IJ SOLN
INTRAMUSCULAR | Status: DC | PRN
  Administered 2017-11-23: 30 mL via PERINEURAL

## 2017-11-23 MED ORDER — OXYCODONE HCL 5 MG PO TABS: 5.0000 mg | ORAL_TABLET | ORAL | 0 refills | Status: AC | PRN

## 2017-11-23 MED ORDER — FENTANYL CITRATE (PF) 250 MCG/5ML IJ SOLN
INTRAMUSCULAR | Status: AC
  Filled 2017-11-23: qty 5

## 2017-11-23 MED ORDER — MIDAZOLAM HCL 2 MG/2ML IJ SOLN
INTRAMUSCULAR | Status: AC
  Filled 2017-11-23: qty 2

## 2017-11-23 MED ORDER — LOSARTAN POTASSIUM 50 MG PO TABS
100.0000 mg | ORAL_TABLET | Freq: Every day | ORAL | Status: DC
  Administered 2017-11-23 – 2017-11-24 (×2): 100 mg via ORAL
  Filled 2017-11-23 (×2): qty 2

## 2017-11-23 MED ORDER — ACETAMINOPHEN 325 MG PO TABS
650.0000 mg | ORAL_TABLET | Freq: Four times a day (QID) | ORAL | Status: DC | PRN
  Administered 2017-11-23 – 2017-11-24 (×2): 650 mg via ORAL
  Filled 2017-11-23 (×2): qty 2

## 2017-11-23 MED ORDER — TOBRAMYCIN 0.3 % OP SOLN
1.0000 [drp] | Freq: Three times a day (TID) | OPHTHALMIC | Status: DC | PRN
  Filled 2017-11-23: qty 5

## 2017-11-23 MED ORDER — BUPIVACAINE HCL (PF) 0.25 % IJ SOLN
INTRAMUSCULAR | Status: AC
  Filled 2017-11-23: qty 30

## 2017-11-23 MED ORDER — CLONAZEPAM 0.5 MG PO TABS: 0.5000 mg | ORAL_TABLET | Freq: Two times a day (BID) | ORAL | Status: DC | PRN

## 2017-11-23 MED ORDER — ACETAMINOPHEN 10 MG/ML IV SOLN
INTRAVENOUS | Status: AC
  Filled 2017-11-23: qty 100

## 2017-11-23 MED ORDER — DIPHENHYDRAMINE HCL 12.5 MG/5ML PO ELIX: 12.5000 mg | ORAL_SOLUTION | ORAL | Status: DC | PRN

## 2017-11-23 MED ORDER — HYDROMORPHONE HCL 1 MG/ML IJ SOLN
INTRAMUSCULAR | Status: AC
  Filled 2017-11-23: qty 1

## 2017-11-23 MED ORDER — LIDOCAINE HCL (CARDIAC) PF 100 MG/5ML IV SOSY
PREFILLED_SYRINGE | INTRAVENOUS | Status: DC | PRN
  Administered 2017-11-23: 40 mg via INTRAVENOUS

## 2017-11-23 MED ORDER — FAMOTIDINE 20 MG PO TABS
20.0000 mg | ORAL_TABLET | Freq: Every day | ORAL | Status: DC
  Administered 2017-11-23 – 2017-11-24 (×2): 20 mg via ORAL
  Filled 2017-11-23 (×2): qty 1

## 2017-11-23 MED ORDER — PHENYLEPHRINE HCL 10 MG/ML IJ SOLN
INTRAMUSCULAR | Status: DC | PRN
  Administered 2017-11-23: 80 ug via INTRAVENOUS
  Administered 2017-11-23: 40 ug via INTRAVENOUS
  Administered 2017-11-23 (×3): 80 ug via INTRAVENOUS
  Administered 2017-11-23: 40 ug via INTRAVENOUS
  Administered 2017-11-23: 80 ug via INTRAVENOUS

## 2017-11-23 MED ORDER — ROCURONIUM BROMIDE 100 MG/10ML IV SOLN
INTRAVENOUS | Status: DC | PRN
  Administered 2017-11-23: 40 mg via INTRAVENOUS
  Administered 2017-11-23 (×2): 10 mg via INTRAVENOUS

## 2017-11-23 MED ORDER — HYDROCHLOROTHIAZIDE 25 MG PO TABS
25.0000 mg | ORAL_TABLET | Freq: Every day | ORAL | Status: DC
  Administered 2017-11-23 – 2017-11-24 (×2): 25 mg via ORAL
  Filled 2017-11-23 (×2): qty 1

## 2017-11-23 MED ORDER — ONDANSETRON HCL 4 MG/2ML IJ SOLN: 4.0000 mg | Freq: Four times a day (QID) | INTRAMUSCULAR | Status: DC | PRN

## 2017-11-23 MED ORDER — CEFAZOLIN SODIUM-DEXTROSE 2-4 GM/100ML-% IV SOLN
2.0000 g | INTRAVENOUS | Status: DC
  Filled 2017-11-23: qty 100

## 2017-11-23 MED ORDER — SUGAMMADEX SODIUM 500 MG/5ML IV SOLN
INTRAVENOUS | Status: DC | PRN
  Administered 2017-11-23: 240 mg via INTRAVENOUS

## 2017-11-23 MED ORDER — ACETAMINOPHEN 10 MG/ML IV SOLN
INTRAVENOUS | Status: DC | PRN
  Administered 2017-11-23: 1000 mg via INTRAVENOUS

## 2017-11-23 MED ORDER — FLUTICASONE PROPIONATE 50 MCG/ACT NA SUSP
2.0000 | Freq: Every day | NASAL | Status: DC | PRN
  Filled 2017-11-23: qty 16

## 2017-11-23 MED ORDER — HYDROMORPHONE HCL 1 MG/ML IJ SOLN
0.2500 mg | INTRAMUSCULAR | Status: DC | PRN
  Administered 2017-11-23 (×2): 0.5 mg via INTRAVENOUS

## 2017-11-23 MED ORDER — ACETAMINOPHEN 650 MG RE SUPP: 650.0000 mg | Freq: Four times a day (QID) | RECTAL | Status: DC | PRN

## 2017-11-23 MED ORDER — SUCCINYLCHOLINE CHLORIDE 20 MG/ML IJ SOLN
INTRAMUSCULAR | Status: DC | PRN
  Administered 2017-11-23: 120 mg via INTRAVENOUS

## 2017-11-23 MED ORDER — POVIDONE-IODINE 10 % EX SWAB: 2.0000 "application " | Freq: Once | CUTANEOUS | Status: DC

## 2017-11-23 MED ORDER — DEXAMETHASONE SODIUM PHOSPHATE 10 MG/ML IJ SOLN
INTRAMUSCULAR | Status: AC
  Filled 2017-11-23: qty 1

## 2017-11-23 MED ORDER — LOTEPREDNOL ETABONATE 0.5 % OP SUSP
1.0000 [drp] | Freq: Three times a day (TID) | OPHTHALMIC | Status: DC | PRN
  Filled 2017-11-23: qty 5

## 2017-11-23 MED ORDER — FENTANYL CITRATE (PF) 100 MCG/2ML IJ SOLN
INTRAMUSCULAR | Status: AC
  Filled 2017-11-23: qty 2

## 2017-11-23 MED ORDER — AMLODIPINE BESYLATE 10 MG PO TABS
10.0000 mg | ORAL_TABLET | Freq: Every day | ORAL | Status: DC
  Administered 2017-11-23 – 2017-11-24 (×2): 10 mg via ORAL
  Filled 2017-11-23 (×2): qty 1

## 2017-11-23 MED ORDER — DEXTROSE 5 % IV SOLN
INTRAVENOUS | Status: DC | PRN
  Administered 2017-11-23: 3 g via INTRAVENOUS

## 2017-11-23 MED ORDER — LOTEPREDNOL-TOBRAMYCIN 0.5-0.3 % OP SUSP: 1.0000 [drp] | Freq: Three times a day (TID) | OPHTHALMIC | Status: DC | PRN

## 2017-11-23 MED ORDER — ONDANSETRON HCL 4 MG PO TABS: 4.0000 mg | ORAL_TABLET | Freq: Four times a day (QID) | ORAL | Status: DC | PRN

## 2017-11-23 MED ORDER — PROPOFOL 10 MG/ML IV BOLUS
INTRAVENOUS | Status: DC | PRN
  Administered 2017-11-23: 160 mg via INTRAVENOUS

## 2017-11-23 MED ORDER — VANCOMYCIN HCL 1000 MG IV SOLR
INTRAVENOUS | Status: AC
  Filled 2017-11-23: qty 1000

## 2017-11-23 MED ORDER — MIDAZOLAM HCL 2 MG/2ML IJ SOLN
1.0000 mg | Freq: Once | INTRAMUSCULAR | Status: AC
  Administered 2017-11-23: 1 mg via INTRAVENOUS

## 2017-11-23 MED ORDER — LACTATED RINGERS IV SOLN
INTRAVENOUS | Status: DC
  Administered 2017-11-23: 13:00:00 via INTRAVENOUS

## 2017-11-23 MED ORDER — SUGAMMADEX SODIUM 500 MG/5ML IV SOLN
INTRAVENOUS | Status: AC
  Filled 2017-11-23: qty 5

## 2017-11-23 MED ORDER — OXYCODONE HCL 5 MG PO TABS
5.0000 mg | ORAL_TABLET | ORAL | Status: DC | PRN
  Administered 2017-11-23 – 2017-11-24 (×3): 10 mg via ORAL
  Filled 2017-11-23 (×3): qty 2

## 2017-11-23 MED ORDER — DOCUSATE SODIUM 100 MG PO CAPS
100.0000 mg | ORAL_CAPSULE | Freq: Two times a day (BID) | ORAL | Status: DC
  Administered 2017-11-23 – 2017-11-24 (×2): 100 mg via ORAL
  Filled 2017-11-23 (×2): qty 1

## 2017-11-23 MED ORDER — LOSARTAN POTASSIUM-HCTZ 100-25 MG PO TABS: 1.0000 | ORAL_TABLET | Freq: Every day | ORAL | Status: DC

## 2017-11-23 MED ORDER — DEXAMETHASONE SODIUM PHOSPHATE 10 MG/ML IJ SOLN
INTRAMUSCULAR | Status: DC | PRN
  Administered 2017-11-23: 4 mg via INTRAVENOUS

## 2017-11-23 MED ORDER — HYDROMORPHONE HCL 1 MG/ML IJ SOLN
1.0000 mg | INTRAMUSCULAR | Status: DC | PRN
  Administered 2017-11-23 – 2017-11-24 (×6): 1 mg via INTRAVENOUS
  Filled 2017-11-23 (×6): qty 1

## 2017-11-23 MED ORDER — 0.9 % SODIUM CHLORIDE (POUR BTL) OPTIME
TOPICAL | Status: DC | PRN
  Administered 2017-11-23: 1000 mL

## 2017-11-23 MED ORDER — ONDANSETRON HCL 4 MG/2ML IJ SOLN
INTRAMUSCULAR | Status: DC | PRN
  Administered 2017-11-23: 4 mg via INTRAVENOUS

## 2017-11-23 MED ORDER — FENTANYL CITRATE (PF) 250 MCG/5ML IJ SOLN
INTRAMUSCULAR | Status: DC | PRN
  Administered 2017-11-23 (×2): 50 ug via INTRAVENOUS

## 2017-11-23 MED ORDER — POLYETHYLENE GLYCOL 3350 17 G PO PACK: 17.0000 g | PACK | Freq: Every day | ORAL | Status: DC | PRN

## 2017-11-23 MED ORDER — BACITRACIN ZINC 500 UNIT/GM EX OINT
TOPICAL_OINTMENT | CUTANEOUS | Status: DC | PRN
  Administered 2017-11-23: 1 via TOPICAL

## 2017-11-23 MED ORDER — SENNA 8.6 MG PO TABS
1.0000 | ORAL_TABLET | Freq: Two times a day (BID) | ORAL | Status: DC
  Administered 2017-11-23 – 2017-11-24 (×2): 8.6 mg via ORAL
  Filled 2017-11-23 (×2): qty 1

## 2017-11-23 MED ORDER — MIDAZOLAM HCL 2 MG/2ML IJ SOLN
INTRAMUSCULAR | Status: AC
  Administered 2017-11-23: 1 mg via INTRAVENOUS
  Filled 2017-11-23: qty 2

## 2017-11-23 MED ORDER — POTASSIUM CHLORIDE IN NACL 20-0.9 MEQ/L-% IV SOLN
INTRAVENOUS | Status: DC
  Administered 2017-11-23: 75 mL/h via INTRAVENOUS
  Filled 2017-11-23: qty 1000

## 2017-11-23 MED ORDER — BACITRACIN ZINC 500 UNIT/GM EX OINT
TOPICAL_OINTMENT | CUTANEOUS | Status: AC
  Filled 2017-11-23: qty 28.35

## 2017-11-23 MED ORDER — SERTRALINE HCL 100 MG PO TABS
100.0000 mg | ORAL_TABLET | Freq: Every day | ORAL | Status: DC
  Administered 2017-11-23 – 2017-11-24 (×2): 100 mg via ORAL
  Filled 2017-11-23 (×2): qty 1

## 2017-11-23 MED ORDER — ZOLPIDEM TARTRATE 5 MG PO TABS
5.0000 mg | ORAL_TABLET | Freq: Every evening | ORAL | Status: DC | PRN
  Administered 2017-11-23: 5 mg via ORAL
  Filled 2017-11-23: qty 1

## 2017-11-23 MED ORDER — VANCOMYCIN HCL 1000 MG IV SOLR
INTRAVENOUS | Status: DC | PRN
  Administered 2017-11-23: 1000 mg via TOPICAL

## 2017-11-23 SURGICAL SUPPLY — 78 items
BANDAGE ACE 3X5.8 VEL STRL LF (GAUZE/BANDAGES/DRESSINGS) ×3 IMPLANT
BANDAGE ACE 4X5 VEL STRL LF (GAUZE/BANDAGES/DRESSINGS) ×3 IMPLANT
BIT DRILL 2.2 SS TIBIAL (BIT) ×3 IMPLANT
BLADE CLIPPER SURG (BLADE) IMPLANT
BNDG COHESIVE 6X5 TAN STRL LF (GAUZE/BANDAGES/DRESSINGS) ×3 IMPLANT
BNDG ESMARK 4X9 LF (GAUZE/BANDAGES/DRESSINGS) ×3 IMPLANT
BNDG GAUZE ELAST 4 BULKY (GAUZE/BANDAGES/DRESSINGS) ×3 IMPLANT
CANISTER SUCTION 2500CC (MISCELLANEOUS) ×3 IMPLANT
CLOSURE STERI-STRIP 1/2X4 (GAUZE/BANDAGES/DRESSINGS) ×1
CLSR STERI-STRIP ANTIMIC 1/2X4 (GAUZE/BANDAGES/DRESSINGS) ×2 IMPLANT
CORDS BIPOLAR (ELECTRODE) ×3 IMPLANT
COVER SURGICAL LIGHT HANDLE (MISCELLANEOUS) ×3 IMPLANT
CUFF TOURNIQUET SINGLE 18IN (TOURNIQUET CUFF) ×3 IMPLANT
CUFF TOURNIQUET SINGLE 24IN (TOURNIQUET CUFF) IMPLANT
DECANTER SPIKE VIAL GLASS SM (MISCELLANEOUS) IMPLANT
DRAPE INCISE IOBAN 66X45 STRL (DRAPES) ×3 IMPLANT
DRAPE OEC MINIVIEW 54X84 (DRAPES) IMPLANT
DRAPE U-SHAPE 47X51 STRL (DRAPES) ×3 IMPLANT
DRSG ADAPTIC 3X8 NADH LF (GAUZE/BANDAGES/DRESSINGS) ×3 IMPLANT
DRSG MEPITEL 4X7.2 (GAUZE/BANDAGES/DRESSINGS) ×3 IMPLANT
ELECT REM PT RETURN 9FT ADLT (ELECTROSURGICAL) ×3
ELECTRODE REM PT RTRN 9FT ADLT (ELECTROSURGICAL) ×1 IMPLANT
GAUZE SPONGE 4X4 12PLY STRL (GAUZE/BANDAGES/DRESSINGS) ×6 IMPLANT
GAUZE XEROFORM 1X8 LF (GAUZE/BANDAGES/DRESSINGS) IMPLANT
GLOVE BIOGEL PI ORTHO PRO 7.5 (GLOVE) ×2
GLOVE BIOGEL PI ORTHO PRO SZ8 (GLOVE) ×2
GLOVE ORTHO TXT STRL SZ7.5 (GLOVE) ×3 IMPLANT
GLOVE PI ORTHO PRO STRL 7.5 (GLOVE) ×1 IMPLANT
GLOVE PI ORTHO PRO STRL SZ8 (GLOVE) ×1 IMPLANT
GLOVE SURG ORTHO 8.0 STRL STRW (GLOVE) IMPLANT
GOWN STRL REUS W/ TWL LRG LVL3 (GOWN DISPOSABLE) IMPLANT
GOWN STRL REUS W/TWL LRG LVL3 (GOWN DISPOSABLE)
K-WIRE .045 CH (WIRE) ×3
K-WIRE 1.6 (WIRE) ×6
K-WIRE FX5X1.6XNS BN SS (WIRE) ×3
KIT BASIN OR (CUSTOM PROCEDURE TRAY) ×3 IMPLANT
KIT TURNOVER KIT B (KITS) ×3 IMPLANT
KWIRE .045 CH (WIRE) ×1 IMPLANT
KWIRE FX5X1.6XNS BN SS (WIRE) ×3 IMPLANT
LOOP VESSEL MAXI BLUE (MISCELLANEOUS) ×3 IMPLANT
MANIFOLD NEPTUNE II (INSTRUMENTS) IMPLANT
NEEDLE 22X1 1/2 (OR ONLY) (NEEDLE) ×3 IMPLANT
NS IRRIG 1000ML POUR BTL (IV SOLUTION) ×3 IMPLANT
PACK ORTHO EXTREMITY (CUSTOM PROCEDURE TRAY) ×3 IMPLANT
PAD ARMBOARD 7.5X6 YLW CONV (MISCELLANEOUS) ×6 IMPLANT
PAD CAST 3X4 CTTN HI CHSV (CAST SUPPLIES) ×1 IMPLANT
PAD CAST 4YDX4 CTTN HI CHSV (CAST SUPPLIES) ×1 IMPLANT
PADDING CAST COTTON 3X4 STRL (CAST SUPPLIES) ×2
PADDING CAST COTTON 4X4 STRL (CAST SUPPLIES) ×2
PLATE DVR ULNA (Plate) ×3 IMPLANT
PLATE MEDIUM DVR LEFT (Plate) ×3 IMPLANT
SCREW LOCK 12X2.7X 3 LD (Screw) ×2 IMPLANT
SCREW LOCK 14X2.7X 3 LD TPR (Screw) ×3 IMPLANT
SCREW LOCK 16X2.7X 3 LD TPR (Screw) ×2 IMPLANT
SCREW LOCK 18X2.7X 3 LD TPR (Screw) ×2 IMPLANT
SCREW LOCK 20X2.7X 3 LD TPR (Screw) ×2 IMPLANT
SCREW LOCK 22X2.7X 3 LD TPR (Screw) ×2 IMPLANT
SCREW LOCKING 2.7X12MM (Screw) ×4 IMPLANT
SCREW LOCKING 2.7X14 (Screw) ×6 IMPLANT
SCREW LOCKING 2.7X15MM (Screw) ×6 IMPLANT
SCREW LOCKING 2.7X16 (Screw) ×4 IMPLANT
SCREW LOCKING 2.7X18 (Screw) ×4 IMPLANT
SCREW LOCKING 2.7X20MM (Screw) ×4 IMPLANT
SCREW LOCKING 2.7X22MM (Screw) ×4 IMPLANT
SLING ARM FOAM STRAP XLG (SOFTGOODS) ×3 IMPLANT
SPLINT PLASTER CAST XFAST 5X30 (CAST SUPPLIES) ×1 IMPLANT
SPLINT PLASTER XFAST SET 5X30 (CAST SUPPLIES) ×2
SUT ETHILON 3 0 PS 1 (SUTURE) ×6 IMPLANT
SUT VIC AB 2-0 CT1 27 (SUTURE) ×4
SUT VIC AB 2-0 CT1 TAPERPNT 27 (SUTURE) ×2 IMPLANT
SUT VIC AB 3-0 FS2 27 (SUTURE) ×3 IMPLANT
SYR CONTROL 10ML LL (SYRINGE) ×3 IMPLANT
TAPE SURG TRANSPORE 1 IN (GAUZE/BANDAGES/DRESSINGS) ×1 IMPLANT
TAPE SURGICAL TRANSPORE 1 IN (GAUZE/BANDAGES/DRESSINGS) ×2
TOWEL OR 17X24 6PK STRL BLUE (TOWEL DISPOSABLE) ×3 IMPLANT
TOWEL OR 17X26 10 PK STRL BLUE (TOWEL DISPOSABLE) ×3 IMPLANT
TUBE CONNECTING 12'X1/4 (SUCTIONS) ×1
TUBE CONNECTING 12X1/4 (SUCTIONS) ×2 IMPLANT

## 2017-11-23 NOTE — Transfer of Care (Signed)
Immediate Anesthesia Transfer of Care Note  Patient: Matthew Bush  Procedure(s) Performed: OPEN REDUCTION INTERNAL FIXATION (ORIF) DISTAL RADIAL FRACTURE (Left Arm Lower)  Patient Location: PACU  Anesthesia Type:General  Level of Consciousness: sedated  Airway & Oxygen Therapy: Patient Spontanous Breathing and Patient connected to face mask oxygen  Post-op Assessment: Report given to RN and Post -op Vital signs reviewed and stable  Post vital signs: Reviewed and stable  Last Vitals:  Vitals Value Taken Time  BP 151/74 11/23/2017  4:17 PM  Temp    Pulse 82 11/23/2017  4:18 PM  Resp 23 11/23/2017  4:18 PM  SpO2 100 % 11/23/2017  4:18 PM  Vitals shown include unvalidated device data.  Last Pain:  Vitals:   11/23/17 1034  TempSrc:   PainSc: 1          Complications: No apparent anesthesia complications

## 2017-11-23 NOTE — Discharge Summary (Signed)
[facility] (OT[facility])  Patient ID: Matthew Bush MRN: [id number] DOB/AGE: 33/09/1985 33 y.o.  Admit date: 11/22/2017 Discharge date: 11/24/2017  Admission Diagnoses:Closed fracture of left distal radius and ulna, initial encounter  Discharge Diagnoses:  Active Problems:   Closed fracture of left distal radius and ulna, initial encounter   Past Medical History:  Diagnosis Date  . GERD (gastroesophageal reflux disease)   . Hypertension     Procedures Performed: 11/23/2017:   Discharged Condition: good  Hospital Course: Patient was admitted following his closed reduction for observation.  He was placed on surgery schedule and underwent the above surgery.  He had a preoperative block.  After surgery his pain was well controlled.  He was tolerating regular diet.  Was discharged home on postoperative day 1.  Consults: None  Significant Diagnostic Studies: None  Treatments: surgery: as above  Discharge Exam: NAD, AAOx3 LUE: Compartments soft and compressible. Motor and sensory intact to median, radial and ulnar nerve. Warm and well perfused hand.  Disposition: Discharge disposition: 01-Home or Self Care       Allergies as of 11/24/2017   No Known Allergies     Medication List    TAKE these medications   amLODipine 10 MG tablet Commonly known as:  NORVASC Take 10 mg by mouth daily.   clonazePAM 1 MG tablet Commonly known as:  KLONOPIN Take 0.5-1 mg by mouth at bedtime as needed for sleep.   fluticasone 50 MCG/ACT nasal spray Commonly known as:  FLONASE Place 2 sprays into both nostrils daily as needed for allergies.   losartan-hydrochlorothiazide 100-25 MG tablet Commonly known as:  HYZAAR Take 1 tablet by mouth daily.   methocarbamol 750 MG tablet Commonly known as:  ROBAXIN-750 Take 1 tablet (750 mg total) by mouth every 6 (six) hours as needed for muscle spasms.   oxyCODONE 5 MG immediate release tablet Commonly known as:  Oxy  IR/ROXICODONE Take 1-2 tablets (5-10 mg total) by mouth every 4 (four) hours as needed for breakthrough pain.   ranitidine 150 MG tablet Commonly known as:  ZANTAC Take 150 mg by mouth daily as needed for heartburn.   sertraline 100 MG tablet Commonly known as:  ZOLOFT Take 100 mg by mouth daily as needed.   ZYLET 0.5-0.3 % Susp Generic drug:  Loteprednol-Tobramycin Place 1 drop into the right eye 3 (three) times daily as needed.      Follow-up Information    [employer], Gillie MannersKevin P, MD. Schedule an appointment as soon as possible for a visit in 2 week(s).   Specialty:  Orthopedic Surgery Contact information: [address] [place] [address] [phone]           Discharge Instructions and Plan: Nonweightbearing to left arm. Return in 2 weeks for suture removal and x-rays. No DVT prophylaxis is needed.  Signed:  Roby LoftsKevin P. Tenoch Mcclure, MD [facility] 11/24/2017, 8:41 AM

## 2017-11-23 NOTE — Anesthesia Procedure Notes (Signed)
Anesthesia Regional Block: Supraclavicular block   Pre-Anesthetic Checklist: ,, timeout performed, Correct Patient, Correct Site, Correct Laterality, Correct Procedure, Correct Position, site marked, Risks and benefits discussed,  Surgical consent,  Pre-op evaluation,  At surgeon's request and post-op pain management  Laterality: Left  Prep: chloraprep       Needles:  Injection technique: Single-shot  Needle Type: Echogenic Needle     Needle Length: 9cm  Needle Gauge: 21     Additional Needles:   Procedures:,,,, ultrasound used (permanent image in chart),,,,  Narrative:  Start time: 11/23/2017 1:25 PM End time: 11/23/2017 1:35 PM Injection made incrementally with aspirations every 5 mL.  Performed by: Personally  Anesthesiologist: Shelton SilvasHollis, Christabell Loseke D, MD  Additional Notes: Patient tolerated the procedure well. Local anesthetic introduced in an incremental fashion under minimal resistance after negative aspirations. No paresthesias were elicited. After completion of the procedure, no acute issues were identified and patient continued to be monitored by RN.
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
## 2017-11-23 NOTE — Anesthesia Procedure Notes (Signed)
Procedure Name: Intubation Date/Time: 11/23/2017 1:53 PM Performed by: Marena ChancyBeckner, Tynia Wiers S, CRNA Pre-anesthesia Checklist: Patient identified, Emergency Drugs available, Suction available and Patient being monitored Patient Re-evaluated:Patient Re-evaluated prior to induction Oxygen Delivery Method: Circle System Utilized Preoxygenation: Pre-oxygenation with 100% oxygen Induction Type: IV induction, Rapid sequence and Cricoid Pressure applied Laryngoscope Size: Glidescope and 4 Grade View: Grade I Tube type: Oral Tube size: 7.0 mm Number of attempts: 1 Airway Equipment and Method: Stylet and Oral airway Placement Confirmation: ETT inserted through vocal cords under direct vision,  positive ETCO2 and breath sounds checked- equal and bilateral Secured at: 21 cm Tube secured with: Tape Dental Injury: Teeth and Oropharynx as per pre-operative assessment
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
## 2017-11-23 NOTE — Anesthesia Postprocedure Evaluation (Signed)
Anesthesia Post Note  Patient: Matthew Bush  Procedure(s) Performed: OPEN REDUCTION INTERNAL FIXATION (ORIF) DISTAL RADIAL FRACTURE (Left Arm Lower)     Patient location during evaluation: PACU Anesthesia Type: General Level of consciousness: awake and alert Pain management: pain level controlled Vital Signs Assessment: post-procedure vital signs reviewed and stable Respiratory status: spontaneous breathing, nonlabored ventilation, respiratory function stable and patient connected to nasal cannula oxygen Cardiovascular status: blood pressure returned to baseline and stable Postop Assessment: no apparent nausea or vomiting Anesthetic complications: no    Last Vitals:  Vitals:   11/23/17 1717 11/23/17 1732  BP: (!) 146/76 (!) 147/74  Pulse: 75 73  Resp: 19 18  Temp:  36.6 C  SpO2: 97% 97%    Last Pain:  Vitals:   11/23/17 1720  TempSrc:   PainSc: 7                  Najla Aughenbaugh,W. EDMOND

## 2017-11-23 NOTE — Anesthesia Preprocedure Evaluation (Addendum)
Anesthesia Evaluation  Patient identified by MRN, date of birth, ID band Patient awake    Reviewed: Allergy & Precautions, NPO status , Patient's Chart, lab work & pertinent test results  Airway Mallampati: III  TM Distance: >3 FB Neck ROM: Full    Dental  (+) Teeth Intact, Dental Advisory Given   Pulmonary neg pulmonary ROS,    breath sounds clear to auscultation       Cardiovascular hypertension, Pt. on medications  Rhythm:Regular Rate:Normal     Neuro/Psych negative neurological ROS     GI/Hepatic Neg liver ROS, GERD  Controlled,  Endo/Other  negative endocrine ROS  Renal/GU negative Renal ROS     Musculoskeletal negative musculoskeletal ROS (+)   Abdominal (+) + obese,   Peds  Hematology negative hematology ROS (+)   Anesthesia Other Findings   Reproductive/Obstetrics                            Anesthesia Physical Anesthesia Plan  ASA: III  Anesthesia Plan: General   Post-op Pain Management: GA combined w/ Regional for post-op pain   Induction: Intravenous  PONV Risk Score and Plan: 3 and Ondansetron, Dexamethasone and Midazolam  Airway Management Planned: LMA  Additional Equipment: None  Intra-op Plan:   Post-operative Plan: Extubation in OR  Informed Consent: I have reviewed the patients History and Physical, chart, labs and discussed the procedure including the risks, benefits and alternatives for the proposed anesthesia with the patient or authorized representative who has indicated his/her understanding and acceptance.   Dental advisory given  Plan Discussed with: CRNA  Anesthesia Plan Comments:        Anesthesia Quick Evaluation
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
## 2017-11-24 ENCOUNTER — Encounter: Payer: Self-pay | Admitting: Family Medicine

## 2017-11-24 ENCOUNTER — Encounter (HOSPITAL_COMMUNITY): Payer: Self-pay | Admitting: Student

## 2017-11-24 NOTE — Plan of Care (Signed)
  Problem: Pain Managment: Goal: General experience of comfort will improve Outcome: Progressing   

## 2017-12-03 DIAGNOSIS — S20211A Contusion of right front wall of thorax, initial encounter: Secondary | ICD-10-CM | POA: Diagnosis not present

## 2017-12-03 DIAGNOSIS — R0781 Pleurodynia: Secondary | ICD-10-CM | POA: Diagnosis not present

## 2017-12-08 DIAGNOSIS — S52602D Unspecified fracture of lower end of left ulna, subsequent encounter for closed fracture with routine healing: Secondary | ICD-10-CM | POA: Diagnosis not present

## 2017-12-08 DIAGNOSIS — S52502D Unspecified fracture of the lower end of left radius, subsequent encounter for closed fracture with routine healing: Secondary | ICD-10-CM | POA: Diagnosis not present

## 2017-12-10 DIAGNOSIS — M25532 Pain in left wrist: Secondary | ICD-10-CM | POA: Diagnosis not present

## 2017-12-10 DIAGNOSIS — M25632 Stiffness of left wrist, not elsewhere classified: Secondary | ICD-10-CM | POA: Diagnosis not present

## 2017-12-15 DIAGNOSIS — M25632 Stiffness of left wrist, not elsewhere classified: Secondary | ICD-10-CM | POA: Diagnosis not present

## 2017-12-15 DIAGNOSIS — M25532 Pain in left wrist: Secondary | ICD-10-CM | POA: Diagnosis not present

## 2017-12-17 DIAGNOSIS — M25532 Pain in left wrist: Secondary | ICD-10-CM | POA: Diagnosis not present

## 2017-12-17 DIAGNOSIS — M25632 Stiffness of left wrist, not elsewhere classified: Secondary | ICD-10-CM | POA: Diagnosis not present

## 2017-12-21 DIAGNOSIS — M25532 Pain in left wrist: Secondary | ICD-10-CM | POA: Diagnosis not present

## 2017-12-21 DIAGNOSIS — M25632 Stiffness of left wrist, not elsewhere classified: Secondary | ICD-10-CM | POA: Diagnosis not present

## 2017-12-24 DIAGNOSIS — M25532 Pain in left wrist: Secondary | ICD-10-CM | POA: Diagnosis not present

## 2017-12-24 DIAGNOSIS — M25632 Stiffness of left wrist, not elsewhere classified: Secondary | ICD-10-CM | POA: Diagnosis not present

## 2017-12-29 DIAGNOSIS — M2560 Stiffness of unspecified joint, not elsewhere classified: Secondary | ICD-10-CM | POA: Diagnosis not present

## 2017-12-29 DIAGNOSIS — M25532 Pain in left wrist: Secondary | ICD-10-CM | POA: Diagnosis not present

## 2017-12-29 DIAGNOSIS — M25632 Stiffness of left wrist, not elsewhere classified: Secondary | ICD-10-CM | POA: Diagnosis not present

## 2017-12-31 DIAGNOSIS — M25632 Stiffness of left wrist, not elsewhere classified: Secondary | ICD-10-CM | POA: Diagnosis not present

## 2017-12-31 DIAGNOSIS — M2560 Stiffness of unspecified joint, not elsewhere classified: Secondary | ICD-10-CM | POA: Diagnosis not present

## 2017-12-31 DIAGNOSIS — M25532 Pain in left wrist: Secondary | ICD-10-CM | POA: Diagnosis not present

## 2018-01-04 DIAGNOSIS — M25532 Pain in left wrist: Secondary | ICD-10-CM | POA: Diagnosis not present

## 2018-01-04 DIAGNOSIS — M2560 Stiffness of unspecified joint, not elsewhere classified: Secondary | ICD-10-CM | POA: Diagnosis not present

## 2018-01-04 DIAGNOSIS — M25632 Stiffness of left wrist, not elsewhere classified: Secondary | ICD-10-CM | POA: Diagnosis not present

## 2018-01-05 DIAGNOSIS — E785 Hyperlipidemia, unspecified: Secondary | ICD-10-CM | POA: Diagnosis not present

## 2018-01-05 DIAGNOSIS — G4700 Insomnia, unspecified: Secondary | ICD-10-CM | POA: Diagnosis not present

## 2018-01-05 DIAGNOSIS — S52502D Unspecified fracture of the lower end of left radius, subsequent encounter for closed fracture with routine healing: Secondary | ICD-10-CM | POA: Diagnosis not present

## 2018-01-05 DIAGNOSIS — Z23 Encounter for immunization: Secondary | ICD-10-CM | POA: Diagnosis not present

## 2018-01-05 DIAGNOSIS — S52602D Unspecified fracture of lower end of left ulna, subsequent encounter for closed fracture with routine healing: Secondary | ICD-10-CM | POA: Diagnosis not present

## 2018-01-05 DIAGNOSIS — I10 Essential (primary) hypertension: Secondary | ICD-10-CM | POA: Diagnosis not present

## 2018-01-06 DIAGNOSIS — M2560 Stiffness of unspecified joint, not elsewhere classified: Secondary | ICD-10-CM | POA: Diagnosis not present

## 2018-01-06 DIAGNOSIS — M25532 Pain in left wrist: Secondary | ICD-10-CM | POA: Diagnosis not present

## 2018-01-06 DIAGNOSIS — M25632 Stiffness of left wrist, not elsewhere classified: Secondary | ICD-10-CM | POA: Diagnosis not present

## 2018-01-11 DIAGNOSIS — M2560 Stiffness of unspecified joint, not elsewhere classified: Secondary | ICD-10-CM | POA: Diagnosis not present

## 2018-01-11 DIAGNOSIS — M25532 Pain in left wrist: Secondary | ICD-10-CM | POA: Diagnosis not present

## 2018-01-11 DIAGNOSIS — M25632 Stiffness of left wrist, not elsewhere classified: Secondary | ICD-10-CM | POA: Diagnosis not present

## 2018-01-14 DIAGNOSIS — M25532 Pain in left wrist: Secondary | ICD-10-CM | POA: Diagnosis not present

## 2018-01-14 DIAGNOSIS — M25632 Stiffness of left wrist, not elsewhere classified: Secondary | ICD-10-CM | POA: Diagnosis not present

## 2018-01-14 DIAGNOSIS — M2560 Stiffness of unspecified joint, not elsewhere classified: Secondary | ICD-10-CM | POA: Diagnosis not present

## 2018-01-18 DIAGNOSIS — M25632 Stiffness of left wrist, not elsewhere classified: Secondary | ICD-10-CM | POA: Diagnosis not present

## 2018-01-18 DIAGNOSIS — M2560 Stiffness of unspecified joint, not elsewhere classified: Secondary | ICD-10-CM | POA: Diagnosis not present

## 2018-01-18 DIAGNOSIS — M25532 Pain in left wrist: Secondary | ICD-10-CM | POA: Diagnosis not present

## 2018-01-20 DIAGNOSIS — M25532 Pain in left wrist: Secondary | ICD-10-CM | POA: Diagnosis not present

## 2018-01-20 DIAGNOSIS — M25632 Stiffness of left wrist, not elsewhere classified: Secondary | ICD-10-CM | POA: Diagnosis not present

## 2018-01-20 DIAGNOSIS — M2560 Stiffness of unspecified joint, not elsewhere classified: Secondary | ICD-10-CM | POA: Diagnosis not present

## 2018-01-25 DIAGNOSIS — M25632 Stiffness of left wrist, not elsewhere classified: Secondary | ICD-10-CM | POA: Diagnosis not present

## 2018-01-25 DIAGNOSIS — M25532 Pain in left wrist: Secondary | ICD-10-CM | POA: Diagnosis not present

## 2018-01-25 DIAGNOSIS — M2560 Stiffness of unspecified joint, not elsewhere classified: Secondary | ICD-10-CM | POA: Diagnosis not present

## 2018-02-10 DIAGNOSIS — M25532 Pain in left wrist: Secondary | ICD-10-CM | POA: Diagnosis not present

## 2018-02-10 DIAGNOSIS — M2560 Stiffness of unspecified joint, not elsewhere classified: Secondary | ICD-10-CM | POA: Diagnosis not present

## 2018-02-10 DIAGNOSIS — M25632 Stiffness of left wrist, not elsewhere classified: Secondary | ICD-10-CM | POA: Diagnosis not present

## 2018-02-16 DIAGNOSIS — S52602D Unspecified fracture of lower end of left ulna, subsequent encounter for closed fracture with routine healing: Secondary | ICD-10-CM | POA: Diagnosis not present

## 2018-02-16 DIAGNOSIS — S52502D Unspecified fracture of the lower end of left radius, subsequent encounter for closed fracture with routine healing: Secondary | ICD-10-CM | POA: Diagnosis not present

## 2018-03-04 DIAGNOSIS — S52602D Unspecified fracture of lower end of left ulna, subsequent encounter for closed fracture with routine healing: Secondary | ICD-10-CM | POA: Diagnosis not present

## 2018-03-04 DIAGNOSIS — S52502D Unspecified fracture of the lower end of left radius, subsequent encounter for closed fracture with routine healing: Secondary | ICD-10-CM | POA: Diagnosis not present

## 2018-03-09 DIAGNOSIS — E7800 Pure hypercholesterolemia, unspecified: Secondary | ICD-10-CM | POA: Diagnosis not present

## 2018-03-22 DIAGNOSIS — S52602K Unspecified fracture of lower end of left ulna, subsequent encounter for closed fracture with nonunion: Secondary | ICD-10-CM | POA: Diagnosis not present

## 2018-03-30 DIAGNOSIS — S52602D Unspecified fracture of lower end of left ulna, subsequent encounter for closed fracture with routine healing: Secondary | ICD-10-CM | POA: Diagnosis not present

## 2018-03-30 DIAGNOSIS — S52502D Unspecified fracture of the lower end of left radius, subsequent encounter for closed fracture with routine healing: Secondary | ICD-10-CM | POA: Diagnosis not present

## 2018-05-05 DIAGNOSIS — S52602D Unspecified fracture of lower end of left ulna, subsequent encounter for closed fracture with routine healing: Secondary | ICD-10-CM | POA: Diagnosis not present

## 2018-05-05 DIAGNOSIS — S52502D Unspecified fracture of the lower end of left radius, subsequent encounter for closed fracture with routine healing: Secondary | ICD-10-CM | POA: Diagnosis not present

## 2018-05-14 DIAGNOSIS — N200 Calculus of kidney: Secondary | ICD-10-CM | POA: Diagnosis not present

## 2018-05-14 DIAGNOSIS — R309 Painful micturition, unspecified: Secondary | ICD-10-CM | POA: Diagnosis not present

## 2018-06-02 DIAGNOSIS — M5432 Sciatica, left side: Secondary | ICD-10-CM | POA: Diagnosis not present

## 2018-06-22 DIAGNOSIS — H35033 Hypertensive retinopathy, bilateral: Secondary | ICD-10-CM | POA: Diagnosis not present

## 2018-07-02 DIAGNOSIS — M5432 Sciatica, left side: Secondary | ICD-10-CM | POA: Diagnosis not present

## 2018-07-07 DIAGNOSIS — I10 Essential (primary) hypertension: Secondary | ICD-10-CM | POA: Diagnosis not present

## 2018-07-07 DIAGNOSIS — E785 Hyperlipidemia, unspecified: Secondary | ICD-10-CM | POA: Diagnosis not present

## 2018-08-03 DIAGNOSIS — S52502D Unspecified fracture of the lower end of left radius, subsequent encounter for closed fracture with routine healing: Secondary | ICD-10-CM | POA: Diagnosis not present
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
## 2021-09-25 ENCOUNTER — Ambulatory Visit: Admission: EM | Admit: 2021-09-25 | Discharge: 2021-09-25 | Discharge status: Against Medical Advice | Payer: 59

## 2022-05-01 ENCOUNTER — Emergency Department (HOSPITAL_BASED_OUTPATIENT_CLINIC_OR_DEPARTMENT_OTHER)
Admission: EM | Admit: 2022-05-01 | Discharge: 2022-05-01 | Discharge status: Against Medical Advice | Payer: 59 | Attending: Emergency Medicine | Admitting: Emergency Medicine

## 2022-05-01 ENCOUNTER — Encounter (HOSPITAL_BASED_OUTPATIENT_CLINIC_OR_DEPARTMENT_OTHER): Payer: Self-pay | Admitting: Urology

## 2022-05-01 DIAGNOSIS — Z5321 Procedure and treatment not carried out due to patient leaving prior to being seen by health care provider: Secondary | ICD-10-CM | POA: Diagnosis not present

## 2022-05-01 DIAGNOSIS — R519 Headache, unspecified: Secondary | ICD-10-CM | POA: Insufficient documentation

## 2022-05-01 DIAGNOSIS — Z20822 Contact with and (suspected) exposure to covid-19: Secondary | ICD-10-CM | POA: Diagnosis not present

## 2022-05-01 DIAGNOSIS — H5710 Ocular pain, unspecified eye: Secondary | ICD-10-CM | POA: Diagnosis not present

## 2022-05-01 LAB — RESP PANEL BY RT-PCR (RSV, FLU A&B, COVID)  RVPGX2
Influenza A by PCR: NEGATIVE
Influenza B by PCR: NEGATIVE
Resp Syncytial Virus by PCR: NEGATIVE
SARS Coronavirus 2 by RT PCR: NEGATIVE

## 2022-05-01 NOTE — ED Triage Notes (Signed)
Pt states headache x 4 days  Sensitive to light and sound  Pain and pressure behind eyes  Denies fever, denies N/V H/o migraine

## 2022-05-01 NOTE — ED Notes (Signed)
No answer for room X3 

## 2024-06-01 ENCOUNTER — Other Ambulatory Visit (HOSPITAL_BASED_OUTPATIENT_CLINIC_OR_DEPARTMENT_OTHER): Payer: Self-pay | Admitting: Family Medicine

## 2024-06-01 DIAGNOSIS — Z8249 Family history of ischemic heart disease and other diseases of the circulatory system: Secondary | ICD-10-CM

## 2024-06-15 ENCOUNTER — Other Ambulatory Visit (HOSPITAL_BASED_OUTPATIENT_CLINIC_OR_DEPARTMENT_OTHER)
